# Patient Record
Sex: Female | Born: 1990 | Race: White | Hispanic: No | Marital: Single | Smoking: Never smoker
Health system: Southern US, Community
[De-identification: ages and names within clinical notes are randomized; demographics above are authoritative.]

## PROBLEM LIST (undated history)

## (undated) ENCOUNTER — Inpatient Hospital Stay (HOSPITAL_COMMUNITY): Payer: Self-pay

## (undated) DIAGNOSIS — J45909 Unspecified asthma, uncomplicated: Secondary | ICD-10-CM

## (undated) DIAGNOSIS — F99 Mental disorder, not otherwise specified: Secondary | ICD-10-CM

## (undated) DIAGNOSIS — R51 Headache: Secondary | ICD-10-CM

## (undated) DIAGNOSIS — R519 Headache, unspecified: Secondary | ICD-10-CM

## (undated) DIAGNOSIS — Z789 Other specified health status: Secondary | ICD-10-CM

## (undated) HISTORY — PX: NO PAST SURGERIES: SHX2092

## (undated) HISTORY — DX: Unspecified asthma, uncomplicated: J45.909

## (undated) HISTORY — DX: Headache, unspecified: R51.9

## (undated) HISTORY — DX: Headache: R51

---

## 2015-10-13 LAB — OB RESULTS CONSOLE RUBELLA ANTIBODY, IGM: RUBELLA: IMMUNE

## 2015-10-13 LAB — OB RESULTS CONSOLE GC/CHLAMYDIA
Chlamydia: NEGATIVE
GC PROBE AMP, GENITAL: NEGATIVE

## 2015-10-13 LAB — OB RESULTS CONSOLE ANTIBODY SCREEN: ANTIBODY SCREEN: NEGATIVE

## 2015-10-13 LAB — OB RESULTS CONSOLE HEPATITIS B SURFACE ANTIGEN: Hepatitis B Surface Ag: NEGATIVE

## 2015-10-13 LAB — OB RESULTS CONSOLE HIV ANTIBODY (ROUTINE TESTING): HIV: NONREACTIVE

## 2015-10-13 LAB — OB RESULTS CONSOLE ABO/RH: RH Type: POSITIVE

## 2015-10-13 LAB — OB RESULTS CONSOLE RPR: RPR: NONREACTIVE

## 2016-03-17 ENCOUNTER — Inpatient Hospital Stay (HOSPITAL_COMMUNITY)
Admission: AD | Admit: 2016-03-17 | Discharge: 2016-03-17 | Disposition: A | Discharge status: Home / Self Care | Payer: Medicaid Other | Source: Ambulatory Visit | Attending: Obstetrics and Gynecology | Admitting: Obstetrics and Gynecology

## 2016-03-17 ENCOUNTER — Encounter (HOSPITAL_COMMUNITY): Payer: Self-pay | Admitting: *Deleted

## 2016-03-17 DIAGNOSIS — M549 Dorsalgia, unspecified: Secondary | ICD-10-CM

## 2016-03-17 DIAGNOSIS — B373 Candidiasis of vulva and vagina: Secondary | ICD-10-CM | POA: Diagnosis not present

## 2016-03-17 DIAGNOSIS — B3731 Acute candidiasis of vulva and vagina: Secondary | ICD-10-CM

## 2016-03-17 DIAGNOSIS — O26893 Other specified pregnancy related conditions, third trimester: Secondary | ICD-10-CM

## 2016-03-17 DIAGNOSIS — Z3A29 29 weeks gestation of pregnancy: Secondary | ICD-10-CM

## 2016-03-17 DIAGNOSIS — O98813 Other maternal infectious and parasitic diseases complicating pregnancy, third trimester: Secondary | ICD-10-CM | POA: Insufficient documentation

## 2016-03-17 DIAGNOSIS — Z3689 Encounter for other specified antenatal screening: Secondary | ICD-10-CM

## 2016-03-17 DIAGNOSIS — O9989 Other specified diseases and conditions complicating pregnancy, childbirth and the puerperium: Secondary | ICD-10-CM

## 2016-03-17 LAB — URINALYSIS, ROUTINE W REFLEX MICROSCOPIC
BILIRUBIN URINE: NEGATIVE
Glucose, UA: NEGATIVE mg/dL
Hgb urine dipstick: NEGATIVE
Ketones, ur: NEGATIVE mg/dL
Leukocytes, UA: NEGATIVE
NITRITE: NEGATIVE
PH: 6 (ref 5.0–8.0)
Protein, ur: NEGATIVE mg/dL
Specific Gravity, Urine: 1.009 (ref 1.005–1.030)

## 2016-03-17 LAB — WET PREP, GENITAL
Clue Cells Wet Prep HPF POC: NONE SEEN
Sperm: NONE SEEN
TRICH WET PREP: NONE SEEN

## 2016-03-17 MED ORDER — FLUCONAZOLE 150 MG PO TABS: 150.0000 mg | ORAL_TABLET | Freq: Every day | ORAL | 0 refills | Status: AC

## 2016-03-17 MED ORDER — ACETAMINOPHEN 500 MG PO TABS
1000.0000 mg | ORAL_TABLET | Freq: Four times a day (QID) | ORAL | Status: DC | PRN
  Filled 2016-03-17: qty 2

## 2016-03-17 NOTE — Discharge Instructions (Signed)
Back Pain in Pregnancy Introduction Back pain during pregnancy is common. Back pain may be caused by several factors that are related to changes during your pregnancy. Follow these instructions at home: Managing pain, stiffness, and swelling  If directed, apply ice for sudden (acute) back pain.  Put ice in a plastic bag.  Place a towel between your skin and the bag.  Leave the ice on for 20 minutes, 2-3 times per day.  If directed, apply heat to the affected area before you exercise:  Place a towel between your skin and the heat pack or heating pad.  Leave the heat on for 20-30 minutes.  Remove the heat if your skin turns bright red. This is especially important if you are unable to feel pain, heat, or cold. You may have a greater risk of getting burned. Activity  Exercise as told by your health care provider. Exercising is the best way to prevent or manage back pain.  Listen to your body when lifting. If lifting hurts, ask for help or bend your knees. This uses your leg muscles instead of your back muscles.  Squat down when picking up something from the floor. Do not bend over.  Only use bed rest as told by your health care provider. Bed rest should only be used for the most severe episodes of back pain. Standing, Sitting, and Lying Down  Do not stand in one place for long periods of time.  Use good posture when sitting. Make sure your head rests over your shoulders and is not hanging forward. Use a pillow on your lower back if necessary.  Try sleeping on your side, preferably the left side, with a pillow or two between your legs. If you are sore after a night's rest, your bed may be too soft. A firm mattress may provide more support for your back during pregnancy. General instructions  Do not wear high heels.  Eat a healthy diet. Try to gain weight within your health care provider's recommendations.  Use a maternity girdle, elastic sling, or back brace as told by your  health care provider.  Take over-the-counter and prescription medicines only as told by your health care provider.  Keep all follow-up visits as told by your health care provider. This is important. This includes any visits with any specialists, such as a physical therapist. Contact a health care provider if:  Your back pain interferes with your daily activities.  You have increasing pain in other parts of your body. Get help right away if:  You develop numbness, tingling, weakness, or problems with the use of your arms or legs.  You develop severe back pain that is not controlled with medicine.  You have a sudden change in bowel or bladder control.  You develop shortness of breath, dizziness, or you faint.  You develop nausea, vomiting, or sweating.  You have back pain that is a rhythmic, cramping pain similar to labor pains. Labor pain is usually 1-2 minutes apart, lasts for about 1 minute, and involves a bearing down feeling or pressure in your pelvis.  You have back pain and your water breaks or you have vaginal bleeding.  You have back pain or numbness that travels down your leg.  Your back pain developed after you fell.  You develop pain on one side of your back.  You see blood in your urine.  You develop skin blisters in the area of your back pain. This information is not intended to replace advice given to   you by your health care provider. Make sure you discuss any questions you have with your health care provider. Document Released: 04/20/2005 Document Revised: 06/18/2015 Document Reviewed: 09/24/2014  2017 [employer]vier  

## 2016-03-17 NOTE — MAU Provider Note (Signed)
History     CSN: [id number]  Arrival date and time: 03/17/16 1622   None     Chief Complaint  Patient presents with  . Contractions   G1 @29 .6 weeks here with ctx and back pain. She reports feeling 2 ctx about 15 min apart around 1100 this am. No ctx since. She reports constant lower back pain since. Rates pain 3/10 now, was worse earlier. Used a heating pad and had much improvement. Denies VB and LOF. +FM. She is eating well and had about 64 oz of water today. She was working when sx started and reports being on her feet a lot and bending down a lot and working 42-48 hrs per week.    OB History    Gravida Para Term Preterm AB Living   1             SAB TAB Ectopic Multiple Live Births                  History reviewed. No pertinent past medical history.  History reviewed. No pertinent surgical history.  History reviewed. No pertinent family history.  Social History  Substance Use Topics  . Smoking status: Not on file  . Smokeless tobacco: Not on file  . Alcohol use Not on file    Allergies: No Known Allergies  Prescriptions Prior to Admission  Medication Sig Dispense Refill Last Dose  . ferrous sulfate 325 (65 FE) MG EC tablet Take 325 mg by mouth daily with breakfast.   03/17/2016 at Unknown time  . Prenatal Vit-Fe Fumarate-FA (PRENATAL MULTIVITAMIN) TABS tablet Take 1 tablet by mouth daily at 12 noon.   03/17/2016 at Unknown time    Review of Systems  Gastrointestinal: Positive for abdominal pain.  Genitourinary: Negative for dysuria, hematuria, vaginal bleeding and vaginal discharge.   Physical Exam   Blood pressure 118/66, pulse 79, temperature 98.7 F (37.1 C), temperature source Oral, resp. rate 18, height 5\' 5"  (1.651 m), weight 67.2 kg (148 lb 1.9 oz).  Physical Exam  Nursing note and vitals reviewed. Constitutional: She is oriented to person, place, and time. She appears well-developed and well-nourished. No distress.  HENT:  Head: Normocephalic and  atraumatic.  Neck: Normal range of motion.  Cardiovascular: Normal rate.   Respiratory: Effort normal.  GI: Soft. She exhibits no distension. There is no tenderness. There is no CVA tenderness.  gravid  Genitourinary:  Genitourinary Comments: External: no lesions or erythema Vagina: rugated, nulli, white curdy discharge SVE: closed/thick  Musculoskeletal: Normal range of motion.  Neurological: She is alert and oriented to person, place, and time.  Skin: Skin is warm and dry.  Psychiatric: She has a normal mood and affect.  EFM: 150 bpm, mod variability, + accels, no decels Toco: x1, mild  Results for orders placed or performed during the hospital encounter of 03/17/16 (from the past 24 hour(s))  Urinalysis, Routine w reflex microscopic     Status: None   Collection Time: 03/17/16  4:30 PM  Result Value Ref Range   Color, Urine YELLOW YELLOW   APPearance CLEAR CLEAR   Specific Gravity, Urine 1.009 1.005 - 1.030   pH 6.0 5.0 - 8.0   Glucose, UA NEGATIVE NEGATIVE mg/dL   Hgb urine dipstick NEGATIVE NEGATIVE   Bilirubin Urine NEGATIVE NEGATIVE   Ketones, ur NEGATIVE NEGATIVE mg/dL   Protein, ur NEGATIVE NEGATIVE mg/dL   Nitrite NEGATIVE NEGATIVE   Leukocytes, UA NEGATIVE NEGATIVE  Wet prep, genital  Status: Abnormal   Collection Time: 03/17/16  5:20 PM  Result Value Ref Range   Yeast Wet Prep HPF POC PRESENT (A) NONE SEEN   Trich, Wet Prep NONE SEEN NONE SEEN   Clue Cells Wet Prep HPF POC NONE SEEN NONE SEEN   WBC, Wet Prep HPF POC MODERATE (A) NONE SEEN   Sperm NONE SEEN     MAU Course  Procedures Tylenol ordered-declined  MDM Labs ordered and reviewed. No evidence of PTL or UTI. Will treat yeast  Presentation, clinical findings, and plan discussed with Dr. Rana SnareLowe. Stable for discharge home.  Assessment and Plan   1. [redacted] weeks gestation of pregnancy   2. NST (non-stress test) reactive   3. Back pain affecting pregnancy in third trimester   4. Yeast vaginitis     Discharge home PTL precautions Follow up in office as scheduled  Maternity support belt Heating pad/warm bath  Allergies as of 03/17/2016   No Known Allergies     Medication List    TAKE these medications   ferrous sulfate 325 (65 FE) MG EC tablet Take 325 mg by mouth daily with breakfast.   fluconazole 150 MG tablet Commonly known as:  DIFLUCAN Take 1 tablet (150 mg total) by mouth daily.   prenatal multivitamin Tabs tablet Take 1 tablet by mouth daily at 12 noon.      Donette LarryMelanie Fonda Rochon, CNM 03/17/2016, 5:07 PM

## 2016-03-17 NOTE — MAU Note (Signed)
Pt reports she was at work and she felt 2 contractions at 11:00 today. Then her back started hurting. Back pain is constant andgets worse when she bends over. Feeling pain in her lower right hip when the baby"kicks it"

## 2016-03-18 LAB — GC/CHLAMYDIA PROBE AMP (~~LOC~~) NOT AT ARMC
CHLAMYDIA, DNA PROBE: NEGATIVE
NEISSERIA GONORRHEA: NEGATIVE

## 2016-04-26 ENCOUNTER — Inpatient Hospital Stay (HOSPITAL_COMMUNITY)
Admission: AD | Admit: 2016-04-26 | Discharge: 2016-04-26 | Disposition: A | Discharge status: Home / Self Care | Payer: 59 | Source: Ambulatory Visit | Attending: Family Medicine | Admitting: Family Medicine

## 2016-04-26 ENCOUNTER — Encounter (HOSPITAL_COMMUNITY): Payer: Self-pay

## 2016-04-26 DIAGNOSIS — Z79899 Other long term (current) drug therapy: Secondary | ICD-10-CM | POA: Insufficient documentation

## 2016-04-26 DIAGNOSIS — O9989 Other specified diseases and conditions complicating pregnancy, childbirth and the puerperium: Secondary | ICD-10-CM | POA: Diagnosis not present

## 2016-04-26 DIAGNOSIS — K529 Noninfective gastroenteritis and colitis, unspecified: Secondary | ICD-10-CM

## 2016-04-26 DIAGNOSIS — O26899 Other specified pregnancy related conditions, unspecified trimester: Secondary | ICD-10-CM | POA: Diagnosis not present

## 2016-04-26 DIAGNOSIS — Z3A00 Weeks of gestation of pregnancy not specified: Secondary | ICD-10-CM | POA: Diagnosis not present

## 2016-04-26 DIAGNOSIS — R1110 Vomiting, unspecified: Secondary | ICD-10-CM | POA: Diagnosis present

## 2016-04-26 HISTORY — DX: Other specified health status: Z78.9

## 2016-04-26 LAB — URINALYSIS, ROUTINE W REFLEX MICROSCOPIC
Bilirubin Urine: NEGATIVE
Glucose, UA: NEGATIVE mg/dL
HGB URINE DIPSTICK: NEGATIVE
Ketones, ur: NEGATIVE mg/dL
Leukocytes, UA: NEGATIVE
Nitrite: NEGATIVE
Protein, ur: NEGATIVE mg/dL
SPECIFIC GRAVITY, URINE: 1.019 (ref 1.005–1.030)
pH: 6 (ref 5.0–8.0)

## 2016-04-26 MED ORDER — PROMETHAZINE HCL 25 MG/ML IJ SOLN: 12.5000 mg | Freq: Once | INTRAMUSCULAR | Status: DC

## 2016-04-26 MED ORDER — ONDANSETRON 4 MG PO TBDP
4.0000 mg | ORAL_TABLET | Freq: Three times a day (TID) | ORAL | 0 refills | Status: DC | PRN
Start: 2016-04-26 — End: 2016-05-27

## 2016-04-26 MED ORDER — PROMETHAZINE HCL 25 MG/ML IJ SOLN
25.0000 mg | Freq: Once | INTRAMUSCULAR | Status: AC
  Administered 2016-04-26: 25 mg via INTRAVENOUS
  Filled 2016-04-26: qty 1

## 2016-04-26 MED ORDER — PROMETHAZINE HCL 25 MG PO TABS: 25.0000 mg | ORAL_TABLET | Freq: Four times a day (QID) | ORAL | 0 refills | Status: DC | PRN

## 2016-04-26 MED ORDER — FAMOTIDINE IN NACL 20-0.9 MG/50ML-% IV SOLN
20.0000 mg | Freq: Once | INTRAVENOUS | Status: AC
  Administered 2016-04-26: 20 mg via INTRAVENOUS
  Filled 2016-04-26: qty 50

## 2016-04-26 MED ORDER — LACTATED RINGERS IV BOLUS (SEPSIS)
1000.0000 mL | Freq: Once | INTRAVENOUS | Status: AC
  Administered 2016-04-26: 1000 mL via INTRAVENOUS

## 2016-04-26 MED ORDER — DEXTROSE 5 % IN LACTATED RINGERS IV BOLUS
1000.0000 mL | Freq: Once | INTRAVENOUS | Status: AC
  Administered 2016-04-26: 1000 mL via INTRAVENOUS

## 2016-04-26 NOTE — Discharge Instructions (Signed)

## 2016-04-26 NOTE — MAU Provider Note (Signed)
History     CSN: [id number]  Arrival date and time: 04/26/16 0910   First Provider Initiated Contact with Patient 04/26/16 [phone]      Chief Complaint  Patient presents with  . Emesis   HPI   Alicia [name] is a 26 y.o. female G1P0 here in MAU with acute onset of vomiting. At 0317 she started vomiting. Her significant other had the same symptoms on Friday. She attests to vomiting over 20 times since onset.   OB History    Gravida Para Term Preterm AB Living   1             SAB TAB Ectopic Multiple Live Births                  Past Medical History:  Diagnosis Date  . Medical history non-contributory     Past Surgical History:  Procedure Laterality Date  . NO PAST SURGERIES      Family History  Problem Relation Age of Onset  . Hypertension Mother   . Diabetes Maternal Grandmother     Social History  Substance Use Topics  . Smoking status: Never Smoker  . Smokeless tobacco: Never Used  . Alcohol use No    Allergies: No Known Allergies  Prescriptions Prior to Admission  Medication Sig Dispense Refill Last Dose  . ferrous sulfate 325 (65 FE) MG EC tablet Take 325 mg by mouth daily with breakfast.   03/17/2016 at Unknown time  . Prenatal Vit-Fe Fumarate-FA (PRENATAL MULTIVITAMIN) TABS tablet Take 1 tablet by mouth daily at 12 noon.   03/17/2016 at Unknown time   No results found for this or any previous visit (from the past 48 hour(s)).  Review of Systems  Constitutional: Negative for fever.  Gastrointestinal: Positive for diarrhea, nausea and vomiting.  Neurological: Negative for dizziness and headaches.   Physical Exam   Blood pressure (!) 119/57, pulse 99, temperature 99.3 F (37.4 C), temperature source Oral, resp. rate 18, height  (1.651 m), SpO2 100 %.  Physical Exam  Constitutional: She is oriented to person, place, and time. She appears well-developed and well-nourished. She has a sickly appearance.  HENT:  Head: Normocephalic.  Eyes: Pupils  are equal, round, and reactive to light.  Musculoskeletal: Normal range of motion.  Neurological: She is alert and oriented to person, place, and time.  Skin: Skin is warm.  Psychiatric: Her behavior is normal.   Fetal Tracing:  Baseline: 145 Variability: moderate Accelerations: 15x15 Decelerations: none  Toco: irr ctx MAU Course  Procedures  Results for orders placed or performed during the hospital encounter of 04/26/16 (from the past 24 hour(s))  Urinalysis, Routine w reflex microscopic     Status: Abnormal   Collection Time: 04/26/16  9:20 AM  Result Value Ref Range   Color, Urine YELLOW YELLOW   APPearance HAZY (A) CLEAR   Specific Gravity, Urine 1.019 1.005 - 1.030   pH 6.0 5.0 - 8.0   Glucose, UA NEGATIVE NEGATIVE mg/dL   Hgb urine dipstick NEGATIVE NEGATIVE   Bilirubin Urine NEGATIVE NEGATIVE   Ketones, ur NEGATIVE NEGATIVE mg/dL   Protein, ur NEGATIVE NEGATIVE mg/dL   Nitrite NEGATIVE NEGATIVE   Leukocytes, UA NEGATIVE NEGATIVE     MDM  UA - pending  LR bolus X 1 D5LR bolus X 1 Pepcid 20 mg IV Phenergan 25 mg IV: patient has significant other here to drive her home.   Report given to Collene Gobble CNM who resumes care of  the patient.  Duane Lope, NP  No vomiting since receiving IV phenergan & pt able to keep down crackers & ginger ale. Will d/c home with antiemetics Assessment and Plan  A: 1. Gastroenteritis, acute    P: Discharge home Rx phenergan & zofran Discussed reasons to return to MAU Keep follow up appointment with OB/PCP  Good hand hygiene, don't prepare food for others, & advance diet as tolerated  Judeth Horn, NP

## 2016-04-26 NOTE — MAU Note (Signed)
Pt c/o vomiting that started at 0300am today. Pt c/o diarrhea that started shortly after. Pt states she is unable to keep water down. Pt states baby is moving normally. Pt denies contractions, bleeding, and leaking of fluid.

## 2016-05-17 ENCOUNTER — Encounter (HOSPITAL_COMMUNITY): Payer: Self-pay

## 2016-05-17 LAB — OB RESULTS CONSOLE GBS: STREP GROUP B AG: NEGATIVE

## 2016-05-23 NOTE — H&P (Signed)
Alicia [name] is a 26 y.o. female presenting for primary cesarean section secondary to breech presentation; declined ECV.  Antepartum course complicated by h/o mood disorder on no medication and stable this pregnancy.  GBS negative.  OB History    Gravida Para Term Preterm AB Living   1             SAB TAB Ectopic Multiple Live Births                 Past Medical History:  Diagnosis Date  . Headache   . Medical history non-contributory   . 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 NO PAST SURGERIES     Family History: family history includes Diabetes in her maternal grandmother; Hypertension in her mother; Other in her cousin. Social History:  reports that she has never smoked. She has never used smokeless tobacco. She reports that she does not drink alcohol or use drugs.     Maternal Diabetes: No Genetic Screening: Normal Maternal Ultrasounds/Referrals: Normal Fetal Ultrasounds or other Referrals:  None Maternal Substance Abuse:  No Significant Maternal Medications:  None Significant Maternal Lab Results:  Lab values include: Group B Strep negative Other Comments:  None  ROS Maternal Medical History:  Prenatal complications: no prenatal complications Prenatal Complications - Diabetes: none.      There were no vitals taken for this visit. Maternal Exam:  Abdomen: Patient reports no abdominal tenderness. Fundal height is c/w dates.   Estimated fetal weight is 7#4.   Fetal presentation: breech     Physical Exam  Constitutional: She is oriented to person, place, and time. She appears well-developed and well-nourished.  GI: Soft. There is no rebound and no guarding.  Neurological: She is alert and oriented to person, place, and time.  Skin: Skin is warm and dry.  Psychiatric: She has a normal mood and affect. Her behavior is normal.    Prenatal labs: ABO, Rh:  O/Positive/-- (09/19 0000) Antibody: Negative (09/19 0000) Rubella: Immune (09/19 0000) RPR: Nonreactive (09/19 0000)  HBsAg: Negative (09/19 0000)  HIV: Non-reactive (09/19 0000)  GBS: Negative (04/24 0000)   Assessment/Plan: 25yo G1 at 39w with breech presentation -Primary C/S -Patient has been counseled re: risk of bleeding, infection, scarring, damage to surrounding structures. And implications in future pregnancies.  All questions were answered and the patient wishes to proceed.  Leela Vanbrocklin 05/23/2016, 8:52 PM

## 2016-05-24 ENCOUNTER — Encounter (HOSPITAL_COMMUNITY)
Admission: RE | Admit: 2016-05-24 | Discharge: 2016-05-24 | Disposition: A | Discharge status: Home / Self Care | Payer: 59 | Source: Ambulatory Visit | Attending: Obstetrics & Gynecology | Admitting: Obstetrics & Gynecology

## 2016-05-24 HISTORY — DX: Mental disorder, not otherwise specified: F99

## 2016-05-24 HISTORY — DX: Headache: R51

## 2016-05-24 HISTORY — DX: Headache, unspecified: R51.9

## 2016-05-24 LAB — TYPE AND SCREEN
ABO/RH(D): O POS
Antibody Screen: NEGATIVE

## 2016-05-24 LAB — ABO/RH: ABO/RH(D): O POS

## 2016-05-24 LAB — CBC
HCT: 33 % — ABNORMAL LOW (ref 36.0–46.0)
Hemoglobin: 11.4 g/dL — ABNORMAL LOW (ref 12.0–15.0)
MCH: 33.9 pg (ref 26.0–34.0)
MCHC: 34.5 g/dL (ref 30.0–36.0)
MCV: 98.2 fL (ref 78.0–100.0)
PLATELETS: 236 10*3/uL (ref 150–400)
RBC: 3.36 MIL/uL — AB (ref 3.87–5.11)
RDW: 14.1 % (ref 11.5–15.5)
WBC: 10.3 10*3/uL (ref 4.0–10.5)

## 2016-05-24 NOTE — Patient Instructions (Signed)
19 Alicia [name]  05/24/2016   Your procedure is scheduled on:  05/25/2016  Enter through the Main Entrance of [facility] at 1100 AM.  Pick up the phone at the desk and dial [phone].   Call this number if you have problems the morning of surgery: [phone]   Remember:   Do not eat food:After Midnight.  Do not drink clear liquids: After Midnight.  Take these medicines the morning of surgery with A SIP OF WATER: none   Do not wear jewelry, make-up or nail polish.  Do not wear lotions, powders, or perfumes. Do not wear deodorant.  Do not shave 48 hours prior to surgery.  Do not bring valuables to the hospital.  [facility] is not   responsible for any belongings or valuables brought to the hospital.  Contacts, dentures or bridgework may not be worn into surgery.  Leave suitcase in the car. After surgery it may be brought to your room.  For patients admitted to the hospital, checkout time is 11:00 AM the day of              discharge.   Patients discharged the day of surgery will not be allowed to drive             home.  Name and phone number of your driver: na  Special Instructions:   N/A   Please read over the following fact sheets that you were given:   Surgical Site Infection Prevention

## 2016-05-25 ENCOUNTER — Encounter (HOSPITAL_COMMUNITY): Payer: Self-pay | Admitting: *Deleted

## 2016-05-25 ENCOUNTER — Inpatient Hospital Stay (HOSPITAL_COMMUNITY): Payer: 59 | Admitting: Anesthesiology

## 2016-05-25 ENCOUNTER — Inpatient Hospital Stay (HOSPITAL_COMMUNITY)
Admission: RE | Admit: 2016-05-25 | Discharge: 2016-05-27 | DRG: 766 | Disposition: A | Discharge status: Home / Self Care | Payer: 59 | Source: Ambulatory Visit | Attending: Obstetrics & Gynecology | Admitting: Obstetrics & Gynecology

## 2016-05-25 ENCOUNTER — Encounter (HOSPITAL_COMMUNITY)
Admission: RE | Disposition: A | Discharge status: Home / Self Care | Payer: Self-pay | Source: Ambulatory Visit | Attending: Obstetrics & Gynecology

## 2016-05-25 DIAGNOSIS — Z3A39 39 weeks gestation of pregnancy: Secondary | ICD-10-CM

## 2016-05-25 DIAGNOSIS — O321XX0 Maternal care for breech presentation, not applicable or unspecified: Secondary | ICD-10-CM | POA: Diagnosis present

## 2016-05-25 DIAGNOSIS — Z98891 History of uterine scar from previous surgery: Secondary | ICD-10-CM

## 2016-05-25 LAB — RPR: RPR Ser Ql: NONREACTIVE

## 2016-05-25 SURGERY — Surgical Case
Anesthesia: Spinal

## 2016-05-25 MED ORDER — SODIUM CHLORIDE 0.9 % IR SOLN
Status: DC | PRN
  Administered 2016-05-25 (×2): 1

## 2016-05-25 MED ORDER — OXYTOCIN 10 UNIT/ML IJ SOLN
INTRAVENOUS | Status: DC | PRN
  Administered 2016-05-25: 40 [IU] via INTRAVENOUS

## 2016-05-25 MED ORDER — SCOPOLAMINE 1 MG/3DAYS TD PT72
1.0000 | MEDICATED_PATCH | Freq: Once | TRANSDERMAL | Status: DC
  Administered 2016-05-25: 1.5 mg via TRANSDERMAL
  Filled 2016-05-25: qty 1

## 2016-05-25 MED ORDER — OXYTOCIN 10 UNIT/ML IJ SOLN
INTRAMUSCULAR | Status: AC
  Filled 2016-05-25: qty 4

## 2016-05-25 MED ORDER — LACTATED RINGERS IV SOLN
INTRAVENOUS | Status: DC
  Administered 2016-05-25: 14:00:00 via INTRAVENOUS

## 2016-05-25 MED ORDER — SIMETHICONE 80 MG PO CHEW
80.0000 mg | CHEWABLE_TABLET | ORAL | Status: DC
  Administered 2016-05-25 – 2016-05-27 (×2): 80 mg via ORAL
  Filled 2016-05-25 (×2): qty 1

## 2016-05-25 MED ORDER — NALOXONE HCL 2 MG/2ML IJ SOSY
1.0000 ug/kg/h | PREFILLED_SYRINGE | INTRAVENOUS | Status: DC | PRN
  Filled 2016-05-25: qty 2

## 2016-05-25 MED ORDER — MORPHINE SULFATE (PF) 0.5 MG/ML IJ SOLN
INTRAMUSCULAR | Status: AC
  Filled 2016-05-25: qty 10

## 2016-05-25 MED ORDER — MEPERIDINE HCL 25 MG/ML IJ SOLN: 6.2500 mg | INTRAMUSCULAR | Status: DC | PRN

## 2016-05-25 MED ORDER — CEFAZOLIN SODIUM-DEXTROSE 2-4 GM/100ML-% IV SOLN
2.0000 g | INTRAVENOUS | Status: AC
  Administered 2016-05-25: 2 g via INTRAVENOUS
  Filled 2016-05-25: qty 100

## 2016-05-25 MED ORDER — NALBUPHINE HCL 10 MG/ML IJ SOLN: 5.0000 mg | Freq: Once | INTRAMUSCULAR | Status: DC | PRN

## 2016-05-25 MED ORDER — KETOROLAC TROMETHAMINE 30 MG/ML IJ SOLN
INTRAMUSCULAR | Status: AC
  Administered 2016-05-25: 30 mg via INTRAMUSCULAR
  Filled 2016-05-25: qty 1

## 2016-05-25 MED ORDER — ZOLPIDEM TARTRATE 5 MG PO TABS: 5.0000 mg | ORAL_TABLET | Freq: Every evening | ORAL | Status: DC | PRN

## 2016-05-25 MED ORDER — PHENYLEPHRINE 8 MG IN D5W 100 ML (0.08MG/ML) PREMIX OPTIME
INJECTION | INTRAVENOUS | Status: AC
  Filled 2016-05-25: qty 100

## 2016-05-25 MED ORDER — SOD CITRATE-CITRIC ACID 500-334 MG/5ML PO SOLN
ORAL | Status: AC
  Filled 2016-05-25: qty 15

## 2016-05-25 MED ORDER — OXYCODONE-ACETAMINOPHEN 5-325 MG PO TABS
1.0000 | ORAL_TABLET | ORAL | Status: DC | PRN
  Administered 2016-05-26 (×2): 1 via ORAL
  Filled 2016-05-25 (×2): qty 1

## 2016-05-25 MED ORDER — SIMETHICONE 80 MG PO CHEW: 80.0000 mg | CHEWABLE_TABLET | ORAL | Status: DC | PRN

## 2016-05-25 MED ORDER — SIMETHICONE 80 MG PO CHEW
80.0000 mg | CHEWABLE_TABLET | Freq: Three times a day (TID) | ORAL | Status: DC
  Administered 2016-05-25 – 2016-05-27 (×5): 80 mg via ORAL
  Filled 2016-05-25 (×5): qty 1

## 2016-05-25 MED ORDER — TETANUS-DIPHTH-ACELL PERTUSSIS 5-2.5-18.5 LF-MCG/0.5 IM SUSP: 0.5000 mL | Freq: Once | INTRAMUSCULAR | Status: DC

## 2016-05-25 MED ORDER — PRENATAL MULTIVITAMIN CH
1.0000 | ORAL_TABLET | Freq: Every day | ORAL | Status: DC
  Filled 2016-05-25 (×2): qty 1

## 2016-05-25 MED ORDER — PROMETHAZINE HCL 25 MG/ML IJ SOLN: 6.2500 mg | INTRAMUSCULAR | Status: DC | PRN

## 2016-05-25 MED ORDER — HYDROMORPHONE HCL 1 MG/ML IJ SOLN: 0.2500 mg | INTRAMUSCULAR | Status: DC | PRN

## 2016-05-25 MED ORDER — WITCH HAZEL-GLYCERIN EX PADS: 1.0000 "application " | MEDICATED_PAD | CUTANEOUS | Status: DC | PRN

## 2016-05-25 MED ORDER — PHENYLEPHRINE 8 MG IN D5W 100 ML (0.08MG/ML) PREMIX OPTIME
INJECTION | INTRAVENOUS | Status: DC | PRN
  Administered 2016-05-25: 60 ug/min via INTRAVENOUS

## 2016-05-25 MED ORDER — NALBUPHINE HCL 10 MG/ML IJ SOLN: 5.0000 mg | INTRAMUSCULAR | Status: DC | PRN

## 2016-05-25 MED ORDER — DIPHENHYDRAMINE HCL 50 MG/ML IJ SOLN: 12.5000 mg | INTRAMUSCULAR | Status: DC | PRN

## 2016-05-25 MED ORDER — ONDANSETRON HCL 4 MG/2ML IJ SOLN
INTRAMUSCULAR | Status: DC | PRN
  Administered 2016-05-25: 4 mg via INTRAVENOUS

## 2016-05-25 MED ORDER — COCONUT OIL OIL
1.0000 "application " | TOPICAL_OIL | Status: DC | PRN
  Administered 2016-05-27: 1 via TOPICAL
  Filled 2016-05-25: qty 120

## 2016-05-25 MED ORDER — KETOROLAC TROMETHAMINE 30 MG/ML IJ SOLN
30.0000 mg | Freq: Four times a day (QID) | INTRAMUSCULAR | Status: AC | PRN
  Administered 2016-05-25: 30 mg via INTRAMUSCULAR

## 2016-05-25 MED ORDER — OXYTOCIN 40 UNITS IN LACTATED RINGERS INFUSION - SIMPLE MED
2.5000 [IU]/h | INTRAVENOUS | Status: AC
Start: 2016-05-25 — End: 2016-05-26

## 2016-05-25 MED ORDER — MORPHINE SULFATE (PF) 0.5 MG/ML IJ SOLN
INTRAMUSCULAR | Status: DC | PRN
  Administered 2016-05-25: .2 mg via EPIDURAL

## 2016-05-25 MED ORDER — ONDANSETRON HCL 4 MG/2ML IJ SOLN
INTRAMUSCULAR | Status: AC
  Filled 2016-05-25: qty 2

## 2016-05-25 MED ORDER — ACETAMINOPHEN 325 MG PO TABS: 650.0000 mg | ORAL_TABLET | ORAL | Status: DC | PRN

## 2016-05-25 MED ORDER — MENTHOL 3 MG MT LOZG: 1.0000 | LOZENGE | OROMUCOSAL | Status: DC | PRN

## 2016-05-25 MED ORDER — DIBUCAINE 1 % RE OINT: 1.0000 "application " | TOPICAL_OINTMENT | RECTAL | Status: DC | PRN

## 2016-05-25 MED ORDER — IBUPROFEN 600 MG PO TABS
600.0000 mg | ORAL_TABLET | Freq: Four times a day (QID) | ORAL | Status: DC
  Administered 2016-05-25 – 2016-05-27 (×7): 600 mg via ORAL
  Filled 2016-05-25 (×7): qty 1

## 2016-05-25 MED ORDER — DIPHENHYDRAMINE HCL 25 MG PO CAPS: 25.0000 mg | ORAL_CAPSULE | Freq: Four times a day (QID) | ORAL | Status: DC | PRN

## 2016-05-25 MED ORDER — SOD CITRATE-CITRIC ACID 500-334 MG/5ML PO SOLN
30.0000 mL | Freq: Once | ORAL | Status: AC
  Administered 2016-05-25: 30 mL via ORAL

## 2016-05-25 MED ORDER — NALOXONE HCL 0.4 MG/ML IJ SOLN
0.4000 mg | INTRAMUSCULAR | Status: DC | PRN
Start: 2016-05-25 — End: 2016-05-27

## 2016-05-25 MED ORDER — DIPHENHYDRAMINE HCL 25 MG PO CAPS
25.0000 mg | ORAL_CAPSULE | ORAL | Status: DC | PRN
  Filled 2016-05-25: qty 1

## 2016-05-25 MED ORDER — SODIUM CHLORIDE 0.9% FLUSH
3.0000 mL | INTRAVENOUS | Status: DC | PRN
Start: 2016-05-25 — End: 2016-05-27

## 2016-05-25 MED ORDER — ONDANSETRON HCL 4 MG/2ML IJ SOLN: 4.0000 mg | Freq: Three times a day (TID) | INTRAMUSCULAR | Status: DC | PRN

## 2016-05-25 MED ORDER — LACTATED RINGERS IV SOLN: INTRAVENOUS | Status: DC

## 2016-05-25 MED ORDER — SENNOSIDES-DOCUSATE SODIUM 8.6-50 MG PO TABS
2.0000 | ORAL_TABLET | ORAL | Status: DC
  Administered 2016-05-25 – 2016-05-27 (×2): 2 via ORAL
  Filled 2016-05-25 (×2): qty 2

## 2016-05-25 MED ORDER — LACTATED RINGERS IV SOLN
INTRAVENOUS | Status: DC | PRN
  Administered 2016-05-25 (×3): via INTRAVENOUS

## 2016-05-25 MED ORDER — SCOPOLAMINE 1 MG/3DAYS TD PT72: 1.0000 | MEDICATED_PATCH | Freq: Once | TRANSDERMAL | Status: DC

## 2016-05-25 MED ORDER — BUPIVACAINE HCL (PF) 0.5 % IJ SOLN
INTRAMUSCULAR | Status: AC
  Filled 2016-05-25: qty 30

## 2016-05-25 MED ORDER — OXYCODONE-ACETAMINOPHEN 5-325 MG PO TABS: 2.0000 | ORAL_TABLET | ORAL | Status: DC | PRN

## 2016-05-25 MED ORDER — BUPIVACAINE HCL (PF) 0.5 % IJ SOLN
INTRAMUSCULAR | Status: DC | PRN
  Administered 2016-05-25: 2.2 mL via INTRATHECAL

## 2016-05-25 MED ORDER — KETOROLAC TROMETHAMINE 30 MG/ML IJ SOLN: 30.0000 mg | Freq: Four times a day (QID) | INTRAMUSCULAR | Status: AC | PRN

## 2016-05-25 SURGICAL SUPPLY — 34 items
BENZOIN TINCTURE PRP APPL 2/3 (GAUZE/BANDAGES/DRESSINGS) ×2 IMPLANT
CHLORAPREP W/TINT 26ML (MISCELLANEOUS) ×2 IMPLANT
CLAMP CORD UMBIL (MISCELLANEOUS) IMPLANT
CLOTH BEACON ORANGE TIMEOUT ST (SAFETY) ×2 IMPLANT
DERMABOND ADVANCED (GAUZE/BANDAGES/DRESSINGS)
DERMABOND ADVANCED .7 DNX12 (GAUZE/BANDAGES/DRESSINGS) IMPLANT
DRSG OPSITE POSTOP 4X10 (GAUZE/BANDAGES/DRESSINGS) ×2 IMPLANT
ELECT REM PT RETURN 9FT ADLT (ELECTROSURGICAL) ×2
ELECTRODE REM PT RTRN 9FT ADLT (ELECTROSURGICAL) ×1 IMPLANT
EXTRACTOR VACUUM KIWI (MISCELLANEOUS) IMPLANT
GLOVE BIO SURGEON STRL SZ 6 (GLOVE) ×2 IMPLANT
GLOVE BIOGEL M 7.0 STRL (GLOVE) ×2 IMPLANT
GLOVE BIOGEL PI IND STRL 6 (GLOVE) ×2 IMPLANT
GLOVE BIOGEL PI IND STRL 7.0 (GLOVE) ×2 IMPLANT
GLOVE BIOGEL PI INDICATOR 6 (GLOVE) ×2
GLOVE BIOGEL PI INDICATOR 7.0 (GLOVE) ×2
GOWN STRL REUS W/TWL LRG LVL3 (GOWN DISPOSABLE) ×4 IMPLANT
KIT ABG SYR 3ML LUER SLIP (SYRINGE) ×2 IMPLANT
NEEDLE HYPO 25X5/8 SAFETYGLIDE (NEEDLE) ×2 IMPLANT
NS IRRIG 1000ML POUR BTL (IV SOLUTION) ×2 IMPLANT
PACK C SECTION WH (CUSTOM PROCEDURE TRAY) ×2 IMPLANT
PAD OB MATERNITY 4.3X12.25 (PERSONAL CARE ITEMS) ×2 IMPLANT
PENCIL SMOKE EVAC W/HOLSTER (ELECTROSURGICAL) ×2 IMPLANT
STRIP CLOSURE SKIN 1/2X4 (GAUZE/BANDAGES/DRESSINGS) ×2 IMPLANT
SUT CHROMIC 0 CTX 36 (SUTURE) ×6 IMPLANT
SUT MON AB 2-0 CT1 27 (SUTURE) ×2 IMPLANT
SUT PDS AB 0 CT1 27 (SUTURE) IMPLANT
SUT PLAIN 0 NONE (SUTURE) IMPLANT
SUT PLAIN 2 0 (SUTURE) ×1
SUT PLAIN ABS 2-0 CT1 27XMFL (SUTURE) ×1 IMPLANT
SUT VIC AB 0 CT1 36 (SUTURE) IMPLANT
SUT VIC AB 4-0 KS 27 (SUTURE) IMPLANT
TOWEL OR 17X24 6PK STRL BLUE (TOWEL DISPOSABLE) ×2 IMPLANT
TRAY FOLEY BAG SILVER LF 14FR (SET/KITS/TRAYS/PACK) IMPLANT

## 2016-05-25 NOTE — Op Note (Signed)
Danicia L Snellings PROCEDURE DATE: 05/25/2016  PREOPERATIVE DIAGNOSIS: Intrauterine pregnancy at  [redacted]w[redacted]d weeks gestation, breech  POSTOPERATIVE DIAGNOSIS: The same  PROCEDURE: Primary Low Transverse Cesarean Section  SURGEON:  Dr. Mitchel Honour  INDICATIONS: Alicia [name] is a 26 y.o. G1P0 at [redacted]w[redacted]d scheduled for cesarean section secondary to breech presentation.  The risks of cesarean section discussed with the patient included but were not limited to: bleeding which may require transfusion or reoperation; infection which may require antibiotics; injury to bowel, bladder, ureters or other surrounding organs; injury to the fetus; need for additional procedures including hysterectomy in the event of a life-threatening hemorrhage; placental abnormalities wth subsequent pregnancies, incisional problems, thromboembolic phenomenon and other postoperative/anesthesia complications. The patient concurred with the proposed plan, giving informed written consent for the procedure.    FINDINGS:  Viable female infant in breech presentation, APGARs per NICU:  Weight pending.  Clear amniotic fluid.  Intact placenta, three vessel cord.  Grossly normal uterus, ovaries and fallopian tubes. .   ANESTHESIA: Spinal ESTIMATED BLOOD LOSS: 600 ml SPECIMENS: Placenta sent to L&D COMPLICATIONS: None immediate  PROCEDURE IN DETAIL:  The patient received intravenous antibiotics and had sequential compression devices applied to her lower extremities while in the preoperative area.  She was then taken to the operating room where spinal anesthesia was administered and was found to be adequate. She was then placed in a dorsal supine position with a leftward tilt, and prepped and draped in a sterile manner.  A foley catheter was placed into her bladder and attached to constant gravity.  After an adequate timeout was performed, a Pfannenstiel skin incision was made with scalpel and carried through to the underlying layer of fascia.  The fascia was incised in the midline and this incision was extended bilaterally using the Mayo scissors. Kocher clamps were applied to the superior aspect of the fascial incision and the underlying rectus muscles were dissected off bluntly. A similar process was carried out on the inferior aspect of the facial incision. The rectus muscles were separated in the midline bluntly and the peritoneum was entered bluntly.  A bladder flap was created sharply and developed bluntly.  Bladder blade was placed to protect the bladder.  A transverse hysterotomy was made with a scalpel and extended bilaterally bluntly. The bladder blade was then removed. The infant was successfully delivered using typical breech maneuvers, and cord was clamped and cut and infant was handed over to awaiting neonatology team. Uterine massage was then administered and the placenta delivered intact with three-vessel cord. The uterus was cleared of clot and debris.  The hysterotomy was closed with 0 chromic.  A second imbricating suture of 0-chromic was used to reinforce the incision and aid in hemostasis.  The peritoneum and rectus muscles were noted to be hemostatic and were reapproximated using 2-0 monocryl in a running fashion.  The fascia was closed with 0-Vicryl in a running fashion with good restoration of anatomy.  The subcutaneus tissue was copiously irrigated and was reapproximated with three interrupted plain gut sutures.  The skin was closed with 4-0 vicryl in a subcuticular fashion.  Pt tolerated the procedure will.  All counts were correct x2.  Pt went to the recovery room in stable condition.

## 2016-05-25 NOTE — Lactation Note (Signed)
This note was copied from a baby's chart. Lactation Consultation Note  Patient Name: Alicia [name] [id number] Date: 05/25/2016 Reason for consult: Initial assessment  Assisted with first time Mom in PACU following C/S for breech position.  Assisted with positioning baby on the breast in football hold.  Hand expression demonstrated.  Colostrum drop expressed.  Baby latched well, and intermittent swallowing identified.  Basic breastfeeding reviewed as baby was feeding.  Mom encouraged to keep baby STS, and feed baby often on cue with a goal of 8-12 feedings per 24 hrs.  Brochure left with Mom.  Informed of IP and OP Lactation services available to her.  Encouraged her to call prn for assistance.  Consult Status Consult Status: Follow-up Date: 05/26/16 Follow-up type: In-patient    [name] 05/25/2016, 3:29 PM

## 2016-05-25 NOTE — Transfer of Care (Signed)
Immediate Anesthesia Transfer of Care Note  Patient: Alicia [name]  Procedure(s) Performed: Procedure(s) with comments: CESAREAN SECTION (N/A) - PRIMARY EDC 05/27/16 NKDA Odelia Gage, RNFA  Patient Location: PACU  Anesthesia Type:Spinal  Level of Consciousness: awake, alert  and oriented  Airway & Oxygen Therapy: Patient Spontanous Breathing  Post-op Assessment: Report given to RN and Post -op Vital signs reviewed and stable  Post vital signs: Reviewed and stable  Last Vitals:  Vitals:   05/25/16 1148  BP: 116/73  Pulse: 91  Resp: 18  Temp: 36.7 C    Last Pain:  Vitals:   05/25/16 1148  TempSrc: Oral      Patients Stated Pain Goal: 0 (05/25/16 1148)  Complications: No apparent anesthesia complications

## 2016-05-25 NOTE — Progress Notes (Addendum)
No change to H&P. Breech confirmed by u/s.  Mitchel Honour, DO

## 2016-05-25 NOTE — Anesthesia Postprocedure Evaluation (Signed)
Anesthesia Post Note  Patient: Alicia [name]  Procedure(s) Performed: Procedure(s) (LRB): CESAREAN SECTION (N/A)  Patient location during evaluation: PACU Anesthesia Type: Spinal Level of consciousness: awake and alert Pain management: pain level controlled Vital Signs Assessment: post-procedure vital signs reviewed and stable Respiratory status: spontaneous breathing and respiratory function stable Cardiovascular status: blood pressure returned to baseline and stable Postop Assessment: spinal receding Anesthetic complications: no       Last Vitals:  Vitals:   05/25/16 1810 05/25/16 1938  BP: 111/60 110/64  Pulse: (!) 56 (!) 54  Resp:    Temp: 36.9 C 36.8 C    Last Pain:  Vitals:   05/25/16 1938  TempSrc:   PainSc: 4                  Lewie Loron

## 2016-05-25 NOTE — Anesthesia Preprocedure Evaluation (Signed)
Anesthesia Evaluation  Patient identified by MRN, date of birth, ID band Patient awake    Reviewed: Allergy & Precautions, NPO status , Patient's Chart, lab work & pertinent test results  Airway Mallampati: II  TM Distance: >3 FB Neck ROM: Full    Dental no notable dental hx.    Pulmonary neg pulmonary ROS,    Pulmonary exam normal breath sounds clear to auscultation       Cardiovascular negative cardio ROS Normal cardiovascular exam Rhythm:Regular Rate:Normal     Neuro/Psych  Headaches, negative psych ROS   GI/Hepatic negative GI ROS, Neg liver ROS,   Endo/Other  negative endocrine ROS  Renal/GU negative Renal ROS     Musculoskeletal negative musculoskeletal ROS (+)   Abdominal   Peds  Hematology negative hematology ROS (+)   Anesthesia Other Findings   Reproductive/Obstetrics (+) Pregnancy                             Anesthesia Physical Anesthesia Plan  ASA: II  Anesthesia Plan: Spinal   Post-op Pain Management:    Induction:   Airway Management Planned:   Additional Equipment:   Intra-op Plan:   Post-operative Plan:   Informed Consent: I have reviewed the patients History and Physical, chart, labs and discussed the procedure including the risks, benefits and alternatives for the proposed anesthesia with the patient or authorized representative who has indicated his/her understanding and acceptance.     Plan Discussed with:   Anesthesia Plan Comments:         Anesthesia Quick Evaluation

## 2016-05-26 LAB — CBC
HEMATOCRIT: 27.5 % — AB (ref 36.0–46.0)
HEMOGLOBIN: 9.6 g/dL — AB (ref 12.0–15.0)
MCH: 33.7 pg (ref 26.0–34.0)
MCHC: 34.9 g/dL (ref 30.0–36.0)
MCV: 96.5 fL (ref 78.0–100.0)
Platelets: 198 10*3/uL (ref 150–400)
RBC: 2.85 MIL/uL — ABNORMAL LOW (ref 3.87–5.11)
RDW: 14 % (ref 11.5–15.5)
WBC: 11.4 10*3/uL — AB (ref 4.0–10.5)

## 2016-05-26 LAB — BIRTH TISSUE RECOVERY COLLECTION (PLACENTA DONATION)

## 2016-05-26 NOTE — Anesthesia Postprocedure Evaluation (Signed)
Anesthesia Post Note  Patient: Alicia [name]  Procedure(s) Performed: Procedure(s) (LRB): CESAREAN SECTION (N/A)  Patient location during evaluation: Mother Baby Anesthesia Type: Spinal Level of consciousness: oriented and awake and alert Pain management: pain level controlled Vital Signs Assessment: post-procedure vital signs reviewed and stable Respiratory status: spontaneous breathing, respiratory function stable and patient connected to nasal cannula oxygen Cardiovascular status: blood pressure returned to baseline and stable Postop Assessment: no headache, no backache and patient able to bend at knees Anesthetic complications: no        Last Vitals:  Vitals:   05/25/16 2030 05/25/16 2330  BP: 110/70 90/64  Pulse: (!) 53 (!) 52  Resp:  18  Temp:  36.8 C    Last Pain:  Vitals:   05/26/16 0634  TempSrc:   PainSc: 4    Pain Goal: Patients Stated Pain Goal: 0 (05/25/16 1148)               Rica RecordsICKELTON,Kenyada Hy

## 2016-05-26 NOTE — Addendum Note (Signed)
Addendum  created 05/26/16 0819 by Edyth Glomb, CRNA   Sign clinical note    

## 2016-05-26 NOTE — Progress Notes (Signed)
Subjective: Postpartum Day 1: Cesarean Delivery Patient reports tolerating PO and no problems voiding.    Objective: Vital signs in last 24 hours: Temp:  [97.6 F (36.4 C)-98.5 F (36.9 C)] 98.3 F (36.8 C) (05/02 2330) Pulse Rate:  [47-91] 52 (05/02 2330) Resp:  [14-20] 18 (05/02 2330) BP: (90-122)/(60-92) 90/64 (05/02 2330) SpO2:  [97 %-100 %] 100 % (05/02 1938)  Physical Exam:  General: alert and cooperative Lochia: appropriate Uterine Fundus: firm Incision: healing well DVT Evaluation: No evidence of DVT seen on physical exam. Negative Homan's sign. No cords or calf tenderness. No significant calf/ankle edema.   Recent Labs  05/24/16 1025 05/26/16 0524  HGB 11.4* 9.6*  HCT 33.0* 27.5*    Assessment/Plan: Status post Cesarean section. Doing well postoperatively.  Continue current care.  Aadam Zhen G 05/26/2016, 8:18 AM

## 2016-05-26 NOTE — Progress Notes (Signed)
CSW acknowledges consult.  CSW attempted to meet with MOB, however MOB was asleep.  CSW will attempt to visit with MOB at a later time.   Ahilyn Nell Boyd-Gilyard, MSW, LCSW Clinical Social Work [phone]  

## 2016-05-26 NOTE — Lactation Note (Signed)
This note was copied from a baby's chart. Lactation Consultation Note  Patient Name: Alicia [name] ZOXWR'UToday's Date: 05/26/2016   Baby 29 hours old. Mom reports that she attempted to nurse 3 hours earlier, but baby not wanting to nurse. Offered to assist with latching the baby but mom declined stating that her nurse helped her earlier and she feels that she can latch now without assistance. Pointed out to mom that baby has not nursed in over 6 hours, and mom reported that she would nurse her soon.  Maternal Data    Feeding Feeding Type: Breast Fed Length of feed: 0 [name]  LATCH Score/Interventions                      Lactation Tools Discussed/Used     Consult Status      Alicia HayJennifer D Heyli [name] 05/26/2016, 6:46 PM

## 2016-05-27 ENCOUNTER — Ambulatory Visit: Payer: Self-pay

## 2016-05-27 MED ORDER — IBUPROFEN 600 MG PO TABS: 600.0000 mg | ORAL_TABLET | Freq: Four times a day (QID) | ORAL | 0 refills | Status: DC

## 2016-05-27 MED ORDER — OXYCODONE-ACETAMINOPHEN 5-325 MG PO TABS: 1.0000 | ORAL_TABLET | ORAL | 0 refills | Status: DC | PRN

## 2016-05-27 NOTE — Discharge Summary (Signed)
Obstetric Discharge Summary Reason for Admission: cesarean section Prenatal Procedures: ultrasound Intrapartum Procedures: cesarean: low cervical, transverse Postpartum Procedures: none Complications-Operative and Postpartum: none Hemoglobin  Date Value Ref Range Status  05/26/2016 9.6 (L) 12.0 - 15.0 g/dL Final   HCT  Date Value Ref Range Status  05/26/2016 27.5 (L) 36.0 - 46.0 % Final    Physical Exam:  General: alert and cooperative Lochia: appropriate Uterine Fundus: firm Incision: no significant drainage DVT Evaluation: No evidence of DVT seen on physical exam.  Discharge Diagnoses: Term Pregnancy-delivered  Discharge Information: Date: 05/27/2016 Activity: pelvic rest Diet: routine Medications: PNV, Ibuprofen and Percocet Condition: stable Instructions: refer to practice specific booklet Discharge to: home Follow-up Information    MORRIS, MEGAN, DO. Schedule an appointment as soon as possible for a visit in 2 week(s).   Specialty:  Obstetrics and Gynecology Contact information: [address], [address], [address] [place] [address] [phone]        Brandon MelnickMORRIS, MARGARET R, MD .   Specialty:  Pediatrics          Newborn Data: Live born female  Birth Weight: 8 lb 15.4 oz (4065 g) APGAR: 9, 9  Home with mother.  Tadashi Burkel 05/27/2016, 8:14 AM

## 2016-05-27 NOTE — Lactation Note (Signed)
This note was copied from a baby's chart. Lactation Consultation Note  Patient Name: Alicia [name] [id number]'XToday's Date: 05/27/2016   Mom dressed and baby in car seat ready for discharge, baby 5647 hrs old.  Mom states baby is latching and feeding often.  Mom complaining of nipple soreness, denies blisters, cracks, or bruising.  Reviewed importance of a deep latch with a wide gape of mouth when breastfeeding.  Coconut oil given with instructions on use.  Encouraged STS and cue based feedings,Mom encouraged to feed baby 8-12 times/24 hours and with feeding cues. -12 per 24 hrs.  Baby using a pacifier in the car seat.  Talked about importance of baby breastfeeding when baby cues, and to avoid a pacifier for a few weeks. Reminded Mom of OP lactation services including [facility] on Tuesdays at 11 am.  Encouraged Mom to call prn for any assistance .Pediatrician follow up in 2 days.  Alicia [name], Alicia [name] 05/27/2016, 1:02 PM

## 2016-05-27 NOTE — Clinical Social Work Maternal (Signed)
  CLINICAL SOCIAL WORK MATERNAL/CHILD NOTE  Patient Details  Name: Alicia [name] MRN: [id number] Date of Birth: 02-20-1990  Date:  05/27/2016  Clinical Social Worker Initiating Note:  Laurey Arrow Date/ Time Initiated:  05/27/16/1152     Child's Name:  Alicia [name]   Legal Guardian:  Mother (FOB is Alicia [name])   Need for Interpreter:  None   Date of Referral:  05/26/16     Reason for Referral:  Behavioral Health Issues, including SI    Referral Source:  Central Nursery   Address:  [phone], [place] [address]  Phone number:  [phone]   Household Members:  Self, Significant Other   Natural Supports (not living in the home):  Immediate Family, Friends, Extended Family (FOB's immediate and extended family will also be a source of support. )   Professional Supports: None   Employment: Animator   Type of Work: Production assistant, radio   Education:  Database administrator Resources:  Multimedia programmer   Other Resources:      Cultural/Religious Considerations Which May Impact Care:  None Reported  Strengths:  Ability to meet basic needs , Engineer, materials , Home prepared for child , Understanding of illness   Risk Factors/Current Problems:  Mental Health Concerns    Cognitive State:  Able to Concentrate , Alert , Insightful , Linear Thinking    Mood/Affect:  Bright , Happy , Comfortable , Interested    CSW Assessment: CSW met with MOB to complete an assessment for a consult for hx of bipolar disorder.  MOB was inviting, polite, and interested in meeting with CSW.  MOB gave CSW permission to complete the assessment while MOB's mother Alicia [name]) and FOB were present. CSW inquired about MOB's supports, and MOB stated that MOB and FOB will receive a wealth of supports from their immediate and extended family members.  MOB expressed that MOB and FOB are excited about being new parents, and MOB feels like she is prepared for the  baby. CSW inquired about MOB's MH hx.  MOB acknowledged a MH hx however, MOB does not think MOB has a true diagnosis of bipolar disorder.  MOB plans to be re-evaluated in the near future.  CSW inquired about MOB's anger issues and MOB reported, "That was in the past and since counseling I have learned have more self-control.  CSW praised MOB for her improvements, insight, and awareness.  CSW educated MOB about PPD. CSW informed MOB of possible supports and interventions to decrease PPD.  CSW also encouraged MOB to seek medical attention if needed for increased signs or symptoms for PPD. CSW provided MOB with the hospital's support group flyer and encouraged MOB to attend.  CSW reviewed safe sleep and SIDS. MOB and FOB had numerous questions.  FOB acknowledged he smokes tobacco products and CSW processed what FOB could do to reduce the chances of SIDS. MOB asked appropriate question and responded appropriately to CSW questions.  MOB communicated that she has a crib for the baby, and has read information regarding SIDS. CSW provided MOB with CSW contact information and encouraged MOB to contact CSW with any additional questions or concerns.     CSW Plan/Description:  Information/Referral to [employer] , [employer] , No Further Intervention Required/No Barriers to Discharge   Laurey Arrow, MSW, LCSW Clinical Social Work [phone]   Dimple Nanas, LCSW 05/27/2016, 1:55 PM

## 2016-09-16 ENCOUNTER — Encounter: Payer: Self-pay | Admitting: Podiatry

## 2016-09-16 ENCOUNTER — Ambulatory Visit (INDEPENDENT_AMBULATORY_CARE_PROVIDER_SITE_OTHER): Payer: 59 | Admitting: Podiatry

## 2016-09-16 VITALS — BP 135/81 | HR 67

## 2016-09-16 DIAGNOSIS — L600 Ingrowing nail: Secondary | ICD-10-CM | POA: Diagnosis not present

## 2016-09-16 NOTE — Progress Notes (Signed)
   Subjective:    Patient ID: Alicia [name], female    DOB: 1990-05-28, 26 y.o.   MRN: [id number]  HPI 26 year old female presents the office they for concerns of ingrown total left big toe pointing to the lateral aspect. She states it has been drainage and there is redness and pain to the nail. She has noticed pus coming from the area. She has tried cutting out herself unable to do so. She's had no recent treatment for this otherwise. She is currently breast-feeding.   Review of Systems  Skin:       Change in nails       Objective:   Physical Exam General: AAO x3, NAD  Dermatological: There is significant incurvation present on the lateral of the left hallux toenail with localized edema and erythema around this area. There is no ascending cellulitis. No flexors or crepitus. Small amount of clear drainage is expressible able to identify any pus. There is no other lesions identified.  Vascular: Dorsalis Pedis artery and Posterior Tibial artery pedal pulses are 2/4 bilateral with immedate capillary fill time. Pedal hair growth present. There is no pain with calf compression, swelling, warmth, erythema.   Neruologic: Grossly intact via light touch bilateral. Protective threshold with Semmes Wienstein monofilament intact to all pedal sites bilateral.   Musculoskeletal: Tenderness to the left lateral hallux toenail but no other areas of tenderness identified today. No gross boney pedal deformities bilateral. No pain, crepitus, or limitation noted with foot and ankle range of motion bilateral. Muscular strength 5/5 in all groups tested bilateral.  Gait: Unassisted, Nonantalgic.     Assessment & Plan:  26 year old female with left lateral hallux and onychodystrophy with localized infection -Treatment options discussed including all alternatives, risks, and complications -Etiology of symptoms were discussed -At this time, recommended partial nail removal without chemical matricectomy to the  left lateral due to infection. Risks and complications were discussed with the patient for which they understand and written consent was obtained for the procedure. Under sterile conditions a total of 3 mL of of 2% lidocaine plain  was infiltrated in a hallux block fashion. Once anesthetized, the skin was prepped in sterile fashion. A tourniquet was then applied. Next the lateral border of the hallux nail border was sharply excised making sure to remove the entire offending nail border. Once the nail was  Removed, the area was debrided and the underlying skin was intact. The area was irrigated and hemostasis was obtained.  A dry sterile dressing was applied. After application of the dressing the tourniquet was removed and there is found to be an immediate capillary refill time to the digit. The patient tolerated the procedure well any complications. Post procedure instructions were discussed the patient for which he verbally understood. Follow-up in one week for nail check or sooner if any problems are to arise. Discussed signs/symptoms of worsening infection and directed to call the office immediately should any occur or go directly to the emergency room. In the meantime, encouraged to call the office with any questions, concerns, changes symptoms. -As appears the infection is localized recommend and antibiotic ointment and a dressing daily. If the redness is not improving leg scald it'll start antibiotics. As she is breast-feedingbe localized we will hold off on oral we can. She agrees to this.   Ovid Curd, DPM

## 2016-09-16 NOTE — Patient Instructions (Signed)

## 2016-09-19 DIAGNOSIS — L600 Ingrowing nail: Secondary | ICD-10-CM | POA: Insufficient documentation

## 2017-08-09 ENCOUNTER — Encounter: Payer: Self-pay | Admitting: General Practice

## 2017-08-16 ENCOUNTER — Other Ambulatory Visit: Payer: Self-pay | Admitting: Internal Medicine

## 2017-08-16 ENCOUNTER — Ambulatory Visit (INDEPENDENT_AMBULATORY_CARE_PROVIDER_SITE_OTHER): Payer: Managed Care, Other (non HMO) | Admitting: Internal Medicine

## 2017-08-16 ENCOUNTER — Encounter (INDEPENDENT_AMBULATORY_CARE_PROVIDER_SITE_OTHER): Payer: Self-pay

## 2017-08-16 ENCOUNTER — Encounter: Payer: Self-pay | Admitting: Internal Medicine

## 2017-08-16 VITALS — BP 108/68 | HR 74 | Temp 98.6°F | Ht 65.25 in | Wt 129.0 lb

## 2017-08-16 DIAGNOSIS — R0602 Shortness of breath: Secondary | ICD-10-CM

## 2017-08-16 DIAGNOSIS — F410 Panic disorder [episodic paroxysmal anxiety] without agoraphobia: Secondary | ICD-10-CM

## 2017-08-16 DIAGNOSIS — R110 Nausea: Secondary | ICD-10-CM | POA: Diagnosis not present

## 2017-08-16 DIAGNOSIS — E559 Vitamin D deficiency, unspecified: Secondary | ICD-10-CM

## 2017-08-16 DIAGNOSIS — R42 Dizziness and giddiness: Secondary | ICD-10-CM

## 2017-08-16 DIAGNOSIS — R251 Tremor, unspecified: Secondary | ICD-10-CM | POA: Diagnosis not present

## 2017-08-16 DIAGNOSIS — R51 Headache: Secondary | ICD-10-CM

## 2017-08-16 DIAGNOSIS — R519 Headache, unspecified: Secondary | ICD-10-CM

## 2017-08-16 LAB — COMPREHENSIVE METABOLIC PANEL
ALK PHOS: 73 U/L (ref 39–117)
ALT: 13 U/L (ref 0–35)
AST: 19 U/L (ref 0–37)
Albumin: 4.4 g/dL (ref 3.5–5.2)
BILIRUBIN TOTAL: 0.3 mg/dL (ref 0.2–1.2)
BUN: 11 mg/dL (ref 6–23)
CHLORIDE: 106 meq/L (ref 96–112)
CO2: 27 mEq/L (ref 19–32)
Calcium: 9.2 mg/dL (ref 8.4–10.5)
Creatinine, Ser: 0.76 mg/dL (ref 0.40–1.20)
GFR: 96.32 mL/min (ref >60.00)
Glucose, Bld: 91 mg/dL (ref 70–99)
POTASSIUM: 4 meq/L (ref 3.5–5.1)
SODIUM: 140 meq/L (ref 135–145)
Total Protein: 7.3 g/dL (ref 6.0–8.3)

## 2017-08-16 LAB — CBC
HCT: 35.9 % — ABNORMAL LOW (ref 36.0–46.0)
Hemoglobin: 12.3 g/dL (ref 12.0–15.0)
MCHC: 34.3 g/dL (ref 30.0–36.0)
MCV: 90.8 fl (ref 78.0–100.0)
Platelets: 310 10*3/uL (ref 150.0–400.0)
RBC: 3.95 Mil/uL (ref 3.87–5.11)
RDW: 13.9 % (ref 11.5–15.5)
WBC: 7.6 10*3/uL (ref 4.0–10.5)

## 2017-08-16 LAB — VITAMIN B12: VITAMIN B 12: 459 pg/mL (ref 211–911)

## 2017-08-16 LAB — TSH: TSH: 1.98 u[IU]/mL (ref 0.35–4.50)

## 2017-08-16 LAB — VITAMIN D 25 HYDROXY (VIT D DEFICIENCY, FRACTURES): VITD: 19.09 ng/mL — ABNORMAL LOW (ref 30.00–100.00)

## 2017-08-16 MED ORDER — VITAMIN D (ERGOCALCIFEROL) 1.25 MG (50000 UNIT) PO CAPS: 50000.0000 [IU] | ORAL_CAPSULE | ORAL | 0 refills | Status: DC

## 2017-08-16 NOTE — Progress Notes (Signed)
HPI  Pt presents to the clinic today to establish care and for management of the conditions listed below. She has not had a PCP in many years.  Childhood Asthma: This has not affected her as an adult. She does not use an inhalers.  Frequent Headaches: These occur about 3 x week. They are located in her forehead. She describes the pain as sharp and stabbing. She denies visual changes, but has sensitivity to light and sound. She denies dizziness, nausea or vomiting. She takes Tylenol and Excedrin Migraine as needed with good relief. She has never seen neurology.  Mood Disorder: She used to have severe anger issues. She has seen a psychiatrist in the past, diagnosed with possible bipolar, but never treated. She denies SI/HI.  She also "shaking spells". She reports she has had these intermittently for years. It starts out as feeling shaky (inside) no evidence of shaking in the extremities and she feels like she can't get enough oxygen. She feels nauseous, but does not vomit. She feels weak, lightheaded and dizzy. She has not passed out due to this. She is able to sit down for 15-20 minutes until she calms down but will take 4-5 hours for the symptoms to completely resolve. She has checked her vital signs during the events and reports her vitals are normal. She is not sure what triggers this, it occurs randomly. She does not feel anxious or overwhelmed. She denies depression, SI/HI.  Flu: never Tetanus: unsure Pap Smear: 07/2017 Dentist: as needed  Past Medical History:  Diagnosis Date  . Childhood asthma   . Frequent headaches   . Headache   . Medical history non-contributory   . 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 BIOTIN PO Take 2 each by mouth daily.    . Ferrous Sulfate (IRON) 325 (65 Fe) MG TABS Take by mouth.    Colleen Can. JUNEL 1/20 1-20 MG-MCG tablet Take 1 tablet by mouth  daily.  11   No current facility-administered medications for this visit.     No Known Allergies  Family History  Problem Relation Age of Onset  . Hypertension Mother   . Diabetes Maternal Grandmother   . Hyperlipidemia Maternal Grandmother   . Other Cousin        duplicate 15 Q syndrome  . Asthma Maternal Grandfather   . COPD Maternal Grandfather   . Hyperlipidemia Maternal Grandfather   . Dementia Paternal Grandmother   . Alzheimer's disease Paternal Grandfather     Social History   Socioeconomic History  . Marital status: Single    Spouse name: Not on file  . Number of children: Not on file  . Years of education: Not on file  . Highest education level: Not on file  Occupational History  . Not on file  Social Needs  . Financial resource strain: Not on file  . Food insecurity:    Worry: Not on file    Inability: Not on file  . Transportation needs:    Medical: Not on file    Non-medical: Not on file  Tobacco Use  . Smoking status: Never Smoker  . Smokeless tobacco: Never Used  Substance and Sexual Activity  . Alcohol use: Yes    Comment: rare  . Drug use: No  . Sexual activity: Yes    Birth control/protection: None  Lifestyle  . Physical activity:  Days per week: Not on file    Minutes per session: Not on file  . Stress: Not on file  Relationships  . Social connections:    Talks on phone: Not on file    Gets together: Not on file    Attends religious service: Not on file    Active member of club or organization: Not on file    Attends meetings of clubs or organizations: Not on file    Relationship status: Not on file  . Intimate partner violence:    Fear of current or ex partner: Not on file    Emotionally abused: Not on file    Physically abused: Not on file    Forced sexual activity: Not on file  Other Topics Concern  . Not on file  Social History Narrative  . Not on file    ROS:  Constitutional: Pt reports intermittent headaches. Denies  fever, malaise, fatigue, or abrupt weight changes.  HEENT: Denies eye pain, eye redness, ear pain, ringing in the ears, wax buildup, runny nose, nasal congestion, bloody nose, or sore throat. Respiratory: Pt reports intermittent shortness of breath. Denies difficulty breathing, cough or sputum production.   Cardiovascular: Denies chest pain, chest tightness, palpitations or swelling in the hands or feet.  Gastrointestinal: Pt reports intermittent nausea. Denies abdominal pain, bloating, constipation, diarrhea or blood in the stool.  GU: Denies frequency, urgency, pain with urination, blood in urine, odor or discharge. Musculoskeletal: Denies decrease in range of motion, difficulty with gait, muscle pain or joint pain and swelling.  Skin: Denies redness, rashes, lesions or ulcercations.  Neurological: Pt reports intermittent lightheadedness, dizziness. Denies dizziness, difficulty with memory, difficulty with speech or problems with balance and coordination.  Psych: Pt has a history of anger issues. Denies anxiety, depression, SI/HI.  No other specific complaints in a complete review of systems (except as listed in HPI above).  PE:  BP 108/68   Pulse 74   Temp 98.6 F (37 C) (Oral)   Ht 5' 5.25" (1.657 m)   Wt 129 lb (58.5 kg)   LMP 08/15/2017   SpO2 98%   BMI 21.30 kg/m   Wt Readings from Last 3 Encounters:  08/16/17 129 lb (58.5 kg)  05/17/16 163 lb (73.9 kg)  03/17/16 148 lb 1.9 oz (67.2 kg)    General: Appears her stated age, well developed, well nourished in NAD. Skin: Dry and intact. Neck: Neck supple, trachea midline. No masses, lumps or thyromegaly present.  Cardiovascular: Normal rate and rhythm. S1,S2 noted.  No murmur, rubs or gallops noted. No JVD or BLE edema.  Pulmonary/Chest: Normal effort and positive vesicular breath sounds. No respiratory distress. No wheezes, rales or ronchi noted.  Abdomen: Soft and nontender. Normal bowel sounds. Neurological: Alert and  oriented.  Psychiatric: Mood and affect mildly flat. Behavior is normal. Judgment and thought content normal.     Assessment and Plan:  Shaking, Nausea, SOB, Lightheadedness:  Likely panic disorder Will check CBC, CMET, TSH, B12 and Vit D today Consider trial of Fluoxetine for anger issues/panic disorder if labs normal  Make an appt for your annual exam. Nicki Reaper, NP

## 2017-08-16 NOTE — Assessment & Plan Note (Signed)
Discussed identifying triggers and avoid them Continue Tylenol and Execedrin for now Consider preventative medication if headaches continue but want to get panic disorder under control first

## 2017-08-16 NOTE — Patient Instructions (Signed)
Panic Attacks Panic attacks are sudden, short feelings of great fear or discomfort. You may have them for no reason when you are relaxed, when you are uneasy (anxious), or when you are sleeping. Follow these instructions at home:  Take all your medicines as told.  Check with your doctor before starting new medicines.  Keep all doctor visits. Contact a doctor if:  You are not able to take your medicines as told.  Your symptoms do not get better.  Your symptoms get worse. Get help right away if:  Your attacks seem different than your normal attacks.  You have thoughts about hurting yourself or others.  You take panic attack medicine and you have a side effect. This information is not intended to replace advice given to you by your health care provider. Make sure you discuss any questions you have with your health care provider. Document Released: 02/12/2010 Document Revised: 06/18/2015 Document Reviewed: 08/24/2012 Elsevier Interactive Patient Education  2017 Elsevier Inc.  

## 2017-08-16 NOTE — Progress Notes (Signed)
Vit  

## 2017-08-21 ENCOUNTER — Telehealth: Payer: Self-pay

## 2017-08-21 NOTE — Telephone Encounter (Signed)
Advised patient of lab results. I was not able to attach it to the lab result note. Patient does agree to try Lexapro 10 mg daily please. Patient was advised this will be sent in for her. Thank Neta Mendsyou-Rumaldo Difatta V Tahirah Sara, RMA

## 2017-08-23 ENCOUNTER — Other Ambulatory Visit: Payer: Self-pay | Admitting: Internal Medicine

## 2017-08-23 MED ORDER — ESCITALOPRAM OXALATE 10 MG PO TABS: 10.0000 mg | ORAL_TABLET | Freq: Every day | ORAL | 1 refills | Status: DC

## 2017-08-23 NOTE — Telephone Encounter (Signed)
Rx sent through e[facility]scribe  

## 2017-08-23 NOTE — Addendum Note (Signed)
Addended by: Roena MaladyEVONTENNO, Luddie Boghosian Y on: 08/23/2017 09:35 AM   Modules accepted: Orders

## 2017-08-23 NOTE — Telephone Encounter (Signed)
Pt is still waiting on the Lexapro 10 mg to be sent to her pharmacy.  [id number] [place], [place] [address] [phone] (Phone) [phone] (Fax)

## 2017-08-29 ENCOUNTER — Ambulatory Visit: Payer: Self-pay | Admitting: Internal Medicine

## 2017-08-29 NOTE — Telephone Encounter (Signed)
Left message on voicemail.

## 2017-08-29 NOTE — Telephone Encounter (Signed)
Ok, she can go ahead and stop it. Once it is completely out of her system, if she wants to try another medication, she can let me know. If not, we can refer to psychiatry.

## 2017-08-29 NOTE — Telephone Encounter (Signed)
Pt calling to state that she is having an upset stomach from escitalopram (LEXAPRO) 10 MG tablet [[id number]][[id number]]. Pt stomach has been upset since 08/25/17 please advise  Patient started Lexapro last Wednesday- headache Thursday morning and diarrhea started Friday. Patient normally takes Lexapro in am- patient took it today- patient is not taking anything to try to control her diarrhea. Patient is at work now and she can not take calls- please call her number and leave her a message- she will call you back- [phone]. Please let patient know how to stop medication. Reason for Disposition . Caller has URGENT medication question about med that PCP prescribed and triager unable to answer question  Answer Assessment - Initial Assessment Questions 1. DIARRHEA SEVERITY: "How bad is the diarrhea?" "How many extra stools have you had in the past 24 hours than normal?"    - NO DIARRHEA (SCALE 0)   - MILD (SCALE 1-3): Few loose or mushy BMs; increase of 1-3 stools over normal daily number of stools; mild increase in ostomy output.   -  MODERATE (SCALE 4-7): Increase of 4-6 stools daily over normal; moderate increase in ostomy output. * SEVERE (SCALE 8-10; OR 'WORST POSSIBLE'): Increase of 7 or more stools daily over normal; moderate increase in ostomy output; incontinence.     Every hour she goes to bathroom with upset stomach 2. ONSET: "When did the diarrhea begin?"      Friday night 3. BM CONSISTENCY: "How loose or watery is the diarrhea?"      watery since Friday night 4. VOMITING: "Are you also vomiting?" If so, ask: "How many times in the past 24 hours?"      no 5. ABDOMINAL PAIN: "Are you having any abdominal pain?" If yes: "What does it feel like?" (e.g., crampy, dull, intermittent, constant)      When patient has to go- she stomach is tender to touch 6. ABDOMINAL PAIN SEVERITY: If present, ask: "How bad is the pain?"  (e.g., Scale 1-10; mild, moderate, or severe)   - MILD (1-3): doesn't interfere  with normal activities, abdomen soft and not tender to touch    - MODERATE (4-7): interferes with normal activities or awakens from sleep, tender to touch    - SEVERE (8-10): excruciating pain, doubled over, unable to do any normal activities       Moderate- it makes her nauseous 7. ORAL INTAKE: If vomiting, "Have you been able to drink liquids?" "How much fluids have you had in the past 24 hours?"     Patient has been drinking and eating, 96 oz today 8. HYDRATION: "Any signs of dehydration?" (e.g., dry mouth [not just dry lips], too weak to stand, dizziness, new weight loss) "When did you last urinate?"     Yesterday patient felt weak and shaky- she works in [employer],  1 hour- lighter yellow 9. EXPOSURE: "Have you traveled to a foreign country recently?" "Have you been exposed to anyone with diarrhea?" "Could you have eaten any food that was spoiled?"     Patient does not think so 10. ANTIBIOTIC USE: "Are you taking antibiotics now or have you taken antibiotics in the past 2 months?"       no 11. OTHER SYMPTOMS: "Do you have any other symptoms?" (e.g., fever, blood in stool)       No- patient has slight headache in morning 12. PREGNANCY: "Is there any chance you are pregnant?" "When was your last menstrual period?"       No- LMP- 08/16/17  Protocols used: MEDICATION QUESTION CALL-A-AH, DIARRHEA-A-AH

## 2017-08-30 NOTE — Telephone Encounter (Signed)
Pt. Given message from Ms.Baity. Verbalizes understanding.

## 2017-09-13 ENCOUNTER — Encounter (HOSPITAL_COMMUNITY): Payer: Self-pay | Admitting: Emergency Medicine

## 2017-09-13 ENCOUNTER — Ambulatory Visit (HOSPITAL_COMMUNITY)
Admission: EM | Admit: 2017-09-13 | Discharge: 2017-09-13 | Disposition: A | Discharge status: Home / Self Care | Payer: Managed Care, Other (non HMO) | Attending: Family Medicine | Admitting: Family Medicine

## 2017-09-13 ENCOUNTER — Other Ambulatory Visit: Payer: Self-pay

## 2017-09-13 DIAGNOSIS — H5711 Ocular pain, right eye: Secondary | ICD-10-CM

## 2017-09-13 DIAGNOSIS — S0501XA Injury of conjunctiva and corneal abrasion without foreign body, right eye, initial encounter: Secondary | ICD-10-CM

## 2017-09-13 MED ORDER — OFLOXACIN 0.3 % OP SOLN: OPHTHALMIC | 0 refills | Status: DC

## 2017-09-13 MED ORDER — POLYETHYL GLYCOL-PROPYL GLYCOL 0.4-0.3 % OP GEL: 1.0000 "application " | Freq: Every evening | OPHTHALMIC | 0 refills | Status: DC | PRN

## 2017-09-13 NOTE — Discharge Instructions (Signed)
Small corneal abrasion to the 10 o'clock region of the right eye. Use ofloxacin eyedrops as directed on right eye. Artificial tear gel at night. Wait 10-15 minutes between drops, always use artificial tear gel last, as it prevents drops from penetrating through. Monitor for any worsening of symptoms, changes in vision, sensitivity to light, eye swelling, painful eye movement, follow up with ophthalmology for further evaluation. Otherwise, follow up with ophthalmology in 1-2 days for reevaluation and continued monitoring.

## 2017-09-13 NOTE — ED Provider Notes (Signed)
[facility]    CSN: [id number] Arrival date & time: 09/13/17  1927     History   Chief Complaint Chief Complaint  Patient presents with  . Eye Problem    HPI Robbie LisSamantha L Gacek is a 27 y.o. female.   27 year old female comes in for 2-day history of right eye pain, watering.  States child threw a cracker and hit her right eye.  She has foreign body sensation to the right upper quadrant.  Denies vision changes, photophobia.  Has had eye watering, but it is eye crusting in the morning.  Patient wears glasses, but denies contact lens use.  Has been applying over-the-counter eyedrops with mild relief.     Past Medical History:  Diagnosis Date  . Childhood asthma   . Frequent headaches   . Headache   . Medical history non-contributory   . 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 Frequent headaches 08/16/2017  . Panic disorder 08/16/2017    Past Surgical History:  Procedure Laterality Date  . CESAREAN SECTION N/A 05/25/2016   Procedure: CESAREAN SECTION;  Surgeon: Mitchel HonourMegan Morris, DO;  Location: [facility];  Service: Obstetrics;  Laterality: N/A;  PRIMARY EDC 05/27/16 NKDA Odelia GageHeather K, RNFA  . NO PAST SURGERIES      OB History    Gravida  1   Para  1   Term  1   Preterm      AB      Living  1     SAB      TAB      Ectopic      Multiple  0   Live Births  1            Home Medications    Prior to Admission medications   Medication Sig Start Date End Date Taking? Authorizing Provider  BIOTIN PO Take 2 each by mouth daily.   Yes [provider]  Ferrous Sulfate (IRON) 325 (65 Fe) MG TABS Take by mouth.   Yes [provider]  JUNEL 1/20 1-20 MG-MCG tablet Take 1 tablet by mouth daily. 08/07/17  Yes [provider]  Vitamin D, Ergocalciferol, (DRISDOL) 50000 units CAPS capsule Take 1 capsule (50,000 Units total)  by mouth every 7 (seven) days. 08/16/17  Yes Baity, Salvadore Oxfordegina W, NP  ofloxacin (OCUFLOX) 0.3 % ophthalmic solution 1 drop every 4 hours for the first 2 days, then 4 times a day for the next 5 days. 09/13/17   Cathie HoopsYu, Yaslyn Cumby V, PA-C  Polyethyl Glycol-Propyl Glycol (SYSTANE) 0.4-0.3 % GEL ophthalmic gel Place 1 application into both eyes at bedtime as needed. 09/13/17   Belinda FisherYu, Seaton Hofmann V, PA-C    Family History Family History  Problem Relation Age of Onset  . Hypertension Mother   . Diabetes Maternal Grandmother   . Hyperlipidemia Maternal Grandmother   . Other Cousin        duplicate 15 Q syndrome  . Asthma Maternal Grandfather   . COPD Maternal Grandfather   . Hyperlipidemia Maternal Grandfather   . Dementia Paternal Grandmother   . Alzheimer's disease Paternal Grandfather     Social History Social History   Tobacco Use  . Smoking status: Never Smoker  . Smokeless tobacco: Never Used  Substance Use Topics  . Alcohol use: Yes    Comment: rare  . Drug use: No  Allergies   Patient has no known allergies.   Review of Systems Review of Systems  Reason unable to perform ROS: See HPI as above.     Physical Exam Triage Vital Signs ED Triage Vitals  Enc Vitals Group     BP 09/13/17 1941 119/71     Pulse Rate 09/13/17 1941 67     Resp 09/13/17 1941 18     Temp 09/13/17 1941 98.2 F (36.8 C)     Temp Source 09/13/17 1941 Oral     SpO2 09/13/17 1941 98 %     Weight --      Height --      Head Circumference --      Peak Flow --      Pain Score 09/13/17 1938 8     Pain Loc --      Pain Edu? --      Excl. in GC? --    No data found.  Updated Vital Signs BP 119/71 (BP Location: Right Arm)   Pulse 67   Temp 98.2 F (36.8 C) (Oral)   Resp 18   LMP 09/13/2017   SpO2 98%   Visual Acuity Right Eye Distance: 20/25 with glasses Left Eye Distance: 20/20 with glasses Bilateral Distance: with glasses 20/25  Right Eye Near:   Left Eye Near:    Bilateral Near:     Physical Exam    Constitutional: She is oriented to person, place, and time. She appears well-developed and well-nourished. No distress.  HENT:  Head: Normocephalic and atraumatic.  Eyes: Pupils are equal, round, and reactive to light. EOM and lids are normal. Lids are everted and swept, no foreign bodies found. Right conjunctiva is injected. Left conjunctiva is not injected.  No ciliary flush.   Fluorescein stain showed small corneal abrasion to the 10:00 region of the iris.  Negative Seidel sign.  Neurological: She is alert and oriented to person, place, and time.    UC Treatments / Results  Labs (all labs ordered are listed, but only abnormal results are displayed) Labs Reviewed - No data to display  EKG None  Radiology No results found.  Procedures Procedures (including critical care time)  Medications Ordered in UC Medications - No data to display  Initial Impression / Assessment and Plan / UC Course  I have reviewed the triage vital signs and the nursing notes.  Pertinent labs & imaging results that were available during my care of the patient were reviewed by me and considered in my medical decision making (see chart for details).     Patient with a small corneal abrasion to the 10:00 region of the right eye rash.  We will start ofloxacin, artificial tear gel.  Follow-up with ophthalmology for reevaluation, monitoring within 2 days.  Return precautions given.  Patient expresses understanding and agrees to plan.   Final Clinical Impressions(s) / UC Diagnoses   Final diagnoses:  Abrasion of right cornea, initial encounter    ED Prescriptions    Medication Sig Dispense Auth. Provider   ofloxacin (OCUFLOX) 0.3 % ophthalmic solution 1 drop every 4 hours for the first 2 days, then 4 times a day for the next 5 days. 5 mL Radley Barto V, PA-C   Polyethyl Glycol-Propyl Glycol (SYSTANE) 0.4-0.3 % GEL ophthalmic gel Place 1 application into both eyes at bedtime as needed. 1 Bottle Threasa AlphaYu, Shelley Cocke V, PA-C         Vennie Salsbury V, [place]PA-C 09/13/17 2005

## 2017-09-13 NOTE — ED Triage Notes (Signed)
Child hit patient's right eye with a cracker.  Right eye is painful, watery.  Denies blurry vision.  Patient wears eye glasses, does not wear contacts.

## 2017-10-09 ENCOUNTER — Encounter: Payer: Managed Care, Other (non HMO) | Admitting: Internal Medicine

## 2017-10-12 ENCOUNTER — Encounter: Payer: Self-pay | Admitting: Internal Medicine

## 2017-10-12 ENCOUNTER — Ambulatory Visit (INDEPENDENT_AMBULATORY_CARE_PROVIDER_SITE_OTHER): Payer: Managed Care, Other (non HMO) | Admitting: Internal Medicine

## 2017-10-12 VITALS — BP 108/72 | HR 85 | Temp 98.3°F | Ht 65.25 in | Wt 130.0 lb

## 2017-10-12 DIAGNOSIS — R05 Cough: Secondary | ICD-10-CM

## 2017-10-12 DIAGNOSIS — Z0000 Encounter for general adult medical examination without abnormal findings: Secondary | ICD-10-CM

## 2017-10-12 DIAGNOSIS — R300 Dysuria: Secondary | ICD-10-CM | POA: Diagnosis not present

## 2017-10-12 DIAGNOSIS — R059 Cough, unspecified: Secondary | ICD-10-CM

## 2017-10-12 LAB — POC URINALSYSI DIPSTICK (AUTOMATED)
Bilirubin, UA: NEGATIVE
GLUCOSE UA: NEGATIVE
Ketones, UA: NEGATIVE
Nitrite, UA: NEGATIVE
PROTEIN UA: POSITIVE — AB
SPEC GRAV UA: 1.02 (ref 1.010–1.025)
Urobilinogen, UA: 0.2 E.U./dL
pH, UA: 6 (ref 5.0–8.0)

## 2017-10-12 LAB — COMPREHENSIVE METABOLIC PANEL
ALBUMIN: 4.2 g/dL (ref 3.5–5.2)
ALT: 11 U/L (ref 0–35)
AST: 15 U/L (ref 0–37)
Alkaline Phosphatase: 69 U/L (ref 39–117)
BILIRUBIN TOTAL: 0.6 mg/dL (ref 0.2–1.2)
BUN: 7 mg/dL (ref 6–23)
CHLORIDE: 106 meq/L (ref 96–112)
CO2: 28 mEq/L (ref 19–32)
CREATININE: 0.84 mg/dL (ref 0.40–1.20)
Calcium: 9.2 mg/dL (ref 8.4–10.5)
GFR: 85.71 mL/min (ref >60.00)
Glucose, Bld: 83 mg/dL (ref 70–99)
Potassium: 4.2 mEq/L (ref 3.5–5.1)
SODIUM: 140 meq/L (ref 135–145)
TOTAL PROTEIN: 7.4 g/dL (ref 6.0–8.3)

## 2017-10-12 LAB — CBC
HCT: 37.1 % (ref 36.0–46.0)
Hemoglobin: 12.6 g/dL (ref 12.0–15.0)
MCHC: 33.9 g/dL (ref 30.0–36.0)
MCV: 90.8 fl (ref 78.0–100.0)
Platelets: 324 10*3/uL (ref 150.0–400.0)
RBC: 4.09 Mil/uL (ref 3.87–5.11)
RDW: 12.9 % (ref 11.5–15.5)
WBC: 8.1 10*3/uL (ref 4.0–10.5)

## 2017-10-12 LAB — LIPID PANEL
CHOL/HDL RATIO: 3
CHOLESTEROL: 148 mg/dL (ref 0–200)
HDL: 44.4 mg/dL (ref >39.00)
LDL Cholesterol: 86 mg/dL (ref 0–99)
NonHDL: 103.8
Triglycerides: 91 mg/dL (ref 0.0–149.0)
VLDL: 18.2 mg/dL (ref 0.0–40.0)

## 2017-10-12 LAB — HEMOGLOBIN A1C: Hgb A1c MFr Bld: 5.4 % (ref 4.6–6.5)

## 2017-10-12 MED ORDER — AMOXICILLIN-POT CLAVULANATE 875-125 MG PO TABS: 1.0000 | ORAL_TABLET | Freq: Two times a day (BID) | ORAL | 0 refills | Status: DC

## 2017-10-12 MED ORDER — PREDNISONE 10 MG PO TABS: ORAL_TABLET | ORAL | 0 refills | Status: DC

## 2017-10-12 NOTE — Patient Instructions (Signed)

## 2017-10-12 NOTE — Progress Notes (Signed)
Subjective:    Patient ID: Alicia [name], female    DOB: 07-06-1990, 27 y.o.   MRN: [id number]  HPI  Pt presents to the clinic today for her annual exam.  Flu: never Tetanus: unsure Pap Smear: 07/2017 Dentist: as needed  Diet: She does eat meat. She consumes fruits and veggies daily. She does eat fried foods. She drinks mostly water, soda. Exercise: None  Review of Systems  Past Medical History:  Diagnosis Date  . Childhood asthma   . Frequent headaches   . Headache   . Medical history non-contributory   . 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 BIOTIN PO Take 2 each by mouth daily.    . Ferrous Sulfate (IRON) 325 (65 Fe) MG TABS Take by mouth.    Colleen Can 1/20 1-20 MG-MCG tablet Take 1 tablet by mouth daily.  11  . ofloxacin (OCUFLOX) 0.3 % ophthalmic solution 1 drop every 4 hours for the first 2 days, then 4 times a day for the next 5 days. 5 mL 0  . Polyethyl Glycol-Propyl Glycol (SYSTANE) 0.4-0.3 % GEL ophthalmic gel Place 1 application into both eyes at bedtime as needed. 1 Bottle 0  . Vitamin D, Ergocalciferol, (DRISDOL) 50000 units CAPS capsule Take 1 capsule (50,000 Units total) by mouth every 7 (seven) days. 12 capsule 0   No current facility-administered medications for this visit.     No Known Allergies  Family History  Problem Relation Age of Onset  . Hypertension Mother   . Diabetes Maternal Grandmother   . Hyperlipidemia Maternal Grandmother   . Other Cousin        duplicate 15 Q syndrome  . Asthma Maternal Grandfather   . COPD Maternal Grandfather   . Hyperlipidemia Maternal Grandfather   . Dementia Paternal Grandmother   . Alzheimer's disease Paternal Grandfather     Social History   Socioeconomic History  . Marital status: Single    Spouse name: Not on file  . Number of children: Not on file  . Years of education: Not on  file  . Highest education level: Not on file  Occupational History  . Not on file  Social Needs  . Financial resource strain: Not on file  . Food insecurity:    Worry: Not on file    Inability: Not on file  . Transportation needs:    Medical: Not on file    Non-medical: Not on file  Tobacco Use  . Smoking status: Never Smoker  . Smokeless tobacco: Never Used  Substance and Sexual Activity  . Alcohol use: Yes    Comment: rare  . Drug use: No  . Sexual activity: Yes    Birth control/protection: Pill  Lifestyle  . Physical activity:    Days per week: Not on file    Minutes per session: Not on file  . Stress: Not on file  Relationships  . Social connections:    Talks on phone: Not on file    Gets together: Not on file    Attends religious service: Not on file    Active member of club or organization: Not on file    Attends meetings of clubs or organizations: Not on file    Relationship status: Not on file  . Intimate partner violence:    Fear of current or ex partner: Not on file  Emotionally abused: Not on file    Physically abused: Not on file    Forced sexual activity: Not on file  Other Topics Concern  . Not on file  Social History Narrative  . Not on file     Constitutional: Denies fever, malaise, fatigue, headache or abrupt weight changes.  HEENT: Denies eye pain, eye redness, ear pain, ringing in the ears, wax buildup, runny nose, nasal congestion, bloody nose, or sore throat. Respiratory: Pt reports cough. Denies difficulty breathing, shortness of breath, or sputum production.   Cardiovascular: Denies chest pain, chest tightness, palpitations or swelling in the hands or feet.  Gastrointestinal: Denies abdominal pain, bloating, constipation, diarrhea or blood in the stool.  GU: Pt reports dysuria. Denies urgency, frequency, burning sensation, blood in urine, odor or discharge. Musculoskeletal: Denies decrease in range of motion, difficulty with gait, muscle  pain or joint pain and swelling.  Skin: Denies redness, rashes, lesions or ulcercations.  Neurological: Denies dizziness, difficulty with memory, difficulty with speech or problems with balance and coordination.  Psych: Denies anxiety, depression, SI/HI.  No other specific complaints in a complete review of systems (except as listed in HPI above).     Objective:   Physical Exam   BP 108/72   Pulse 85   Temp 98.3 F (36.8 C) (Oral)   Ht 5' 5.25" (1.657 m)   Wt 130 lb (59 kg)   LMP 10/07/2017   SpO2 98%   BMI 21.47 kg/m  Wt Readings from Last 3 Encounters:  10/12/17 130 lb (59 kg)  08/16/17 129 lb (58.5 kg)  05/17/16 163 lb (73.9 kg)    General: Appears her stated age, well developed, well nourished in NAD. Skin: Warm, dry and intact.  HEENT: Head: normal shape and size; Eyes: sclera white, no icterus, conjunctiva pink, PERRLA and EOMs intact; Ears: Tm's gray and intact, normal light reflex;  Throat/Mouth: Teeth present, mucosa pink and moist, + PND, no exudate, lesions or ulcerations noted.  Neck:  Neck supple, trachea midline. No masses, lumps or thyromegaly present.  Cardiovascular: Normal rate and rhythm. S1,S2 noted.  No murmur, rubs or gallops noted. No JVD or BLE edema.  Pulmonary/Chest: Normal effort and positive vesicular breath sounds. No respiratory distress. No wheezes, rales or ronchi noted.  Abdomen: Soft and nontender. Normal bowel sounds. No distention or masses noted. Liver, spleen and kidneys non palpable. No CVA tenderness noted. Musculoskeletal: Strength 5/5 BUE/BLE. No difficulty with gait.  Neurological: Alert and oriented. Cranial nerves II-XII grossly intact. Coordination normal.  Psychiatric: Mood and affect normal. Behavior is normal. Judgment and thought content normal.     BMET    Component Value Date/Time   NA 140 08/16/2017 1503   K 4.0 08/16/2017 1503   CL 106 08/16/2017 1503   CO2 27 08/16/2017 1503   GLUCOSE 91 08/16/2017 1503   BUN 11  08/16/2017 1503   CREATININE 0.76 08/16/2017 1503   CALCIUM 9.2 08/16/2017 1503    Lipid Panel  No results found for: CHOL, TRIG, HDL, CHOLHDL, VLDL, LDLCALC  CBC    Component Value Date/Time   WBC 7.6 08/16/2017 1503   RBC 3.95 08/16/2017 1503   HGB 12.3 08/16/2017 1503   HCT 35.9 (L) 08/16/2017 1503   PLT 310.0 08/16/2017 1503   MCV 90.8 08/16/2017 1503   MCH 33.7 05/26/2016 0524   MCHC 34.3 08/16/2017 1503   RDW 13.9 08/16/2017 1503    Hgb A1C No results found for: HGBA1C  Assessment & Plan:   Preventative Health Maintenance:  She declines flu shot or tetanus booster today Pap smear UTD Encouraged her to consume a balanced diet and exercise regimen Advised her to see a dentist annually Will check CBC, CMET, Lipid profile today  Dysuria:   Urinalysis: 2+ leuks, 2+ blood Will send urine culture eRx for Augmentin BID x 7 days Push fluids Ok to take AZO OTC  Cough:  eRx for Pred Taper x 6 days  RTC in 1 year, sooner if needed Nicki Reaper, NP

## 2017-10-13 ENCOUNTER — Telehealth: Payer: Self-pay | Admitting: Internal Medicine

## 2017-10-13 NOTE — Telephone Encounter (Signed)
Pt's husband dropped off biometric form to be filled out. Placed in RX tower

## 2017-10-14 LAB — URINE CULTURE
MICRO NUMBER:: 91126366
SPECIMEN QUALITY:: ADEQUATE

## 2017-10-23 NOTE — Telephone Encounter (Signed)
Form has been faxed 3 times and fax log says it was received. I will mail original to pt

## 2017-11-09 ENCOUNTER — Other Ambulatory Visit: Payer: Self-pay | Admitting: Internal Medicine

## 2017-11-09 DIAGNOSIS — E559 Vitamin D deficiency, unspecified: Secondary | ICD-10-CM

## 2017-12-20 ENCOUNTER — Encounter: Payer: Self-pay | Admitting: Family Medicine

## 2017-12-20 ENCOUNTER — Ambulatory Visit (INDEPENDENT_AMBULATORY_CARE_PROVIDER_SITE_OTHER): Payer: Managed Care, Other (non HMO) | Admitting: Family Medicine

## 2017-12-20 VITALS — BP 112/76 | HR 71 | Temp 97.8°F | Ht 65.25 in | Wt 135.0 lb

## 2017-12-20 DIAGNOSIS — H6992 Unspecified Eustachian tube disorder, left ear: Secondary | ICD-10-CM

## 2017-12-20 MED ORDER — FLUTICASONE PROPIONATE 50 MCG/ACT NA SUSP: 2.0000 | Freq: Every day | NASAL | 6 refills | Status: DC

## 2017-12-20 NOTE — Patient Instructions (Addendum)
Good to see you today  I have sent in a prescription for a steroid nasal spray that I want you to use 2 sprays in each nostril at bedtime for the next 2 to 3 weeks.  You can use longer than that if needed  You can also use regular Sudafed from behind the pharmacy counter, generic is fine.  I would use one in the morning and 1 after lunch for ear pressure  You can also use Afrin type nasal spray 2 sprays in each nostril twice a day for 4 days maximum  For pain you can use ibuprofen 400 to 600 mg, 2-3 over-the-counter tablets, every 8-12 hours can also use warm compresses if no improvement in 5 to 7 days please follow-up  Eustachian Tube Dysfunction The eustachian tube connects the middle ear to the back of the nose. It regulates air pressure in the middle ear by allowing air to move between the ear and nose. It also helps to drain fluid from the middle ear space. When the eustachian tube does not function properly, air pressure, fluid, or both can build up in the middle ear. Eustachian tube dysfunction can affect one or both ears. What are the causes? This condition happens when the eustachian tube becomes blocked or cannot open normally. This may result from:  Ear infections.  Colds and other upper respiratory infections.  Allergies.  Irritation, such as from cigarette smoke or acid from the stomach coming up into the esophagus (gastroesophageal reflux).  Sudden changes in air pressure, such as from descending in an airplane.  Abnormal growths in the nose or throat, such as nasal polyps, tumors, or enlarged tissue at the back of the throat (adenoids).  What increases the risk? This condition may be more likely to develop in people who smoke and people who are overweight. Eustachian tube dysfunction may also be more likely to develop in children, especially children who have:  Certain birth defects of the mouth, such as cleft palate.  Large tonsils and adenoids.  What are the signs  or symptoms? Symptoms of this condition may include:  A feeling of fullness in the ear.  Ear pain.  Clicking or popping noises in the ear.  Ringing in the ear.  Hearing loss.  Loss of balance.  Symptoms may get worse when the air pressure around you changes, such as when you travel to an area of high elevation or fly on an airplane. How is this diagnosed? This condition may be diagnosed based on:  Your symptoms.  A physical exam of your ear, nose, and throat.  Tests, such as those that measure: ? The movement of your eardrum (tympanogram). ? Your hearing (audiometry).  How is this treated? Treatment depends on the cause and severity of your condition. If your symptoms are mild, you may be able to relieve your symptoms by moving air into ("popping") your ears. If you have symptoms of fluid in your ears, treatment may include:  Decongestants.  Antihistamines.  Nasal sprays or ear drops that contain medicines that reduce swelling (steroids).  In some cases, you may need to have a procedure to drain the fluid in your eardrum (myringotomy). In this procedure, a small tube is placed in the eardrum to:  Drain the fluid.  Restore the air in the middle ear space.  Follow these instructions at home:  Take over-the-counter and prescription medicines only as told by your health care provider.  Use techniques to help pop your ears as recommended by  your health care provider. These may include: ? Chewing gum. ? Yawning. ? Frequent, forceful swallowing. ? Closing your mouth, holding your nose closed, and gently blowing as if you are trying to blow air out of your nose.  Do not do any of the following until your health care provider approves: ? Travel to high altitudes. ? Fly in airplanes. ? Work in a Estate agentpressurized cabin or room. ? Scuba dive.  Keep your ears dry. Dry your ears completely after showering or bathing.  Do not smoke.  Keep all follow-up visits as told by your  health care provider. This is important. Contact a health care provider if:  Your symptoms do not go away after treatment.  Your symptoms come back after treatment.  You are unable to pop your ears.  You have: ? A fever. ? Pain in your ear. ? Pain in your head or neck. ? Fluid draining from your ear.  Your hearing suddenly changes.  You become very dizzy.  You lose your balance. This information is not intended to replace advice given to you by your health care provider. Make sure you discuss any questions you have with your health care provider. Document Released: 02/06/2015 Document Revised: 06/18/2015 Document Reviewed: 01/29/2014 Elsevier Interactive Patient Education  [employer]2018 [employer]

## 2017-12-20 NOTE — Progress Notes (Signed)
   Subjective:    Patient ID: Alicia LisSamantha L [name], female    DOB: 06/06/1990, 27 y.o.   MRN: [id number]  HPI This is a 27 yo female, accompanied by her baby and husband, who presents with ear pain x 4 days. Started on left side and now in both ears. Left > right, feels muffled. Taking sudafed PE without relief. Took two ibuprofen last night and used hydrogen peroxide without relief. No drainage. No fever. Last week had cold with runny nose and cough, those symptoms improved.    Past Medical History:  Diagnosis Date  . Childhood asthma   . Frequent headaches   . Headache   . Medical history non-contributory   . 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 CESAREAN SECTION N/A 05/25/2016   Procedure: CESAREAN SECTION;  Surgeon: Mitchel HonourMegan Morris, DO;  Location: [facility];  Service: Obstetrics;  Laterality: N/A;  PRIMARY EDC 05/27/16 NKDA Odelia GageHeather K, RNFA  . NO PAST SURGERIES     Family History  Problem Relation Age of Onset  . Hypertension Mother   . Diabetes Maternal Grandmother   . Hyperlipidemia Maternal Grandmother   . Other Cousin        duplicate 15 Q syndrome  . Asthma Maternal Grandfather   . COPD Maternal Grandfather   . Hyperlipidemia Maternal Grandfather   . Dementia Paternal Grandmother   . Alzheimer's disease Paternal Grandfather    Social History   Tobacco Use  . Smoking status: Never Smoker  . Smokeless tobacco: Never Used  Substance Use Topics  . Alcohol use: Yes    Comment: rare  . Drug use: No      Review of Systems Per HPI    Objective:   Physical Exam  Constitutional: She appears well-developed and well-nourished. No distress.  HENT:  Head: Normocephalic and atraumatic.  Right Ear: Tympanic membrane, external ear and ear canal normal. No tenderness.  Left Ear: External ear normal. No drainage or tenderness. A middle ear effusion is present.  Eyes:  Conjunctivae are normal.  Neck: Normal range of motion. Neck supple.  Cardiovascular: Normal rate, regular rhythm and normal heart sounds.  Pulmonary/Chest: Effort normal and breath sounds normal. No stridor. No respiratory distress. She has no wheezes. She has no rales.  Lymphadenopathy:    She has no cervical adenopathy.  Skin: Skin is warm. She is not diaphoretic.  Psychiatric: She has a normal mood and affect. Her behavior is normal. Judgment and thought content normal.  Vitals reviewed.    BP 112/76 (BP Location: Right Arm, Patient Position: Sitting, Cuff Size: Normal)   Pulse 71   Temp 97.8 F (36.6 C) (Oral)   Ht 5' 5.25" (1.657 m)   Wt 135 lb (61.2 kg)   LMP 11/26/2017   SpO2 100%   BMI 22.29 kg/m       Assessment & Plan:  1. Eustachian tube disorder, left - Provided written and verbal information regarding diagnosis and treatment. - additional symptomatic relief measures reviewed- ibuprofen, Afrin - RTC precautions reviewed - fluticasone (FLONASE) 50 MCG/ACT nasal spray; Place 2 sprays into both nostrils daily.  Dispense: 16 g; Refill: 6  Olean Reeeborah Rossetta Kama, FNP-BC  [facility]  12/20/2017 10:00 AM

## 2018-01-07 ENCOUNTER — Other Ambulatory Visit: Payer: Self-pay | Admitting: Internal Medicine

## 2018-01-07 DIAGNOSIS — E559 Vitamin D deficiency, unspecified: Secondary | ICD-10-CM

## 2018-01-18 ENCOUNTER — Other Ambulatory Visit: Payer: Self-pay

## 2018-01-18 ENCOUNTER — Encounter (HOSPITAL_COMMUNITY): Payer: Self-pay | Admitting: *Deleted

## 2018-01-18 ENCOUNTER — Inpatient Hospital Stay (HOSPITAL_COMMUNITY)
Admission: AD | Admit: 2018-01-18 | Discharge: 2018-01-18 | Disposition: A | Discharge status: Home / Self Care | Payer: Managed Care, Other (non HMO) | Attending: Obstetrics and Gynecology | Admitting: Obstetrics and Gynecology

## 2018-01-18 DIAGNOSIS — M7918 Myalgia, other site: Secondary | ICD-10-CM

## 2018-01-18 DIAGNOSIS — R1031 Right lower quadrant pain: Secondary | ICD-10-CM | POA: Diagnosis not present

## 2018-01-18 DIAGNOSIS — Z3202 Encounter for pregnancy test, result negative: Secondary | ICD-10-CM | POA: Insufficient documentation

## 2018-01-18 LAB — CBC
HCT: 38.4 % (ref 36.0–46.0)
Hemoglobin: 12.6 g/dL (ref 12.0–15.0)
MCH: 31.9 pg (ref 26.0–34.0)
MCHC: 32.8 g/dL (ref 30.0–36.0)
MCV: 97.2 fL (ref 80.0–100.0)
NRBC: 0 % (ref 0.0–0.2)
Platelets: 274 10*3/uL (ref 150–400)
RBC: 3.95 MIL/uL (ref 3.87–5.11)
RDW: 12.8 % (ref 11.5–15.5)
WBC: 7.8 10*3/uL (ref 4.0–10.5)

## 2018-01-18 LAB — URINALYSIS, ROUTINE W REFLEX MICROSCOPIC
Bilirubin Urine: NEGATIVE
Glucose, UA: NEGATIVE mg/dL
Hgb urine dipstick: NEGATIVE
Ketones, ur: NEGATIVE mg/dL
Leukocytes, UA: NEGATIVE
Nitrite: NEGATIVE
Protein, ur: NEGATIVE mg/dL
SPECIFIC GRAVITY, URINE: 1.034 — AB (ref 1.005–1.030)
pH: 5 (ref 5.0–8.0)

## 2018-01-18 LAB — POCT PREGNANCY, URINE: Preg Test, Ur: NEGATIVE

## 2018-01-18 NOTE — MAU Note (Signed)
Presents with c/o right lower abdominal pain that began last night @ 1800.  Took Tylenol this morning @ 0730, some relief.  Denies VB.  LMP 1st week of December, currently taking BCP's.

## 2018-01-18 NOTE — MAU Provider Note (Signed)
History     CSN: [id number]  Arrival date and time: 01/18/18 [id number]   First Provider Initiated Contact with Patient 01/18/18 1017      Chief Complaint  Patient presents with  . Abdominal Pain   HPI Alicia [name] 27 y.o. Client of [facility].  Comes to MAU today as she had RLQ pain this morning that caused her to bend over and have some sweating.  Unable to stand up straight for awhile.  Has a 4419 month old and had a C/S for that baby.  Thinking she is having right ovary pain.  Currently sitting in bed, very comfortable and talking.  Has used a warm pack on her right side.  Will be going to work today. Has been on birth control pills for the past 5 months.  Denies any nausea, vomiting or diarrhea.  Ate a hash brown this morning.   OB History    Gravida  1   Para  1   Term  1   Preterm      AB      Living  1     SAB      TAB      Ectopic      Multiple  0   Live Births  1           Past Medical History:  Diagnosis Date  . Childhood asthma   . Frequent headaches   . Headache   . Medical history non-contributory   . 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 CESAREAN SECTION N/A 05/25/2016   Procedure: CESAREAN SECTION;  Surgeon: Mitchel HonourMegan Morris, DO;  Location: [facility];  Service: Obstetrics;  Laterality: N/A;  PRIMARY EDC 05/27/16 NKDA Odelia GageHeather K, RNFA  . NO PAST SURGERIES      Family History  Problem Relation Age of Onset  . Hypertension Mother   . Diabetes Maternal Grandmother   . Hyperlipidemia Maternal Grandmother   . Other Cousin        duplicate 15 Q syndrome  . COPD Father   . Asthma Maternal Grandfather   . COPD Maternal Grandfather   . Hyperlipidemia Maternal Grandfather   . Dementia Paternal Grandmother   . Alzheimer's disease Paternal Grandfather     Social History   Tobacco Use  . Smoking status: Never Smoker  .  Smokeless tobacco: Never Used  Substance Use Topics  . Alcohol use: Yes    Comment: rare  . Drug use: No    Allergies:  Allergies  Allergen Reactions  . Lexapro [Escitalopram Oxalate] Diarrhea and Nausea Only    Headache    Medications Prior to Admission  Medication Sig Dispense Refill Last Dose  . BIOTIN PO Take 2 each by mouth daily.   Taking  . Ferrous Sulfate (IRON) 325 (65 Fe) MG TABS Take by mouth.   Taking  . fluticasone (FLONASE) 50 MCG/ACT nasal spray Place 2 sprays into both nostrils daily. 16 g 6   . JUNEL 1/20 1-20 MG-MCG tablet Take 1 tablet by mouth daily.  11 Taking    Review of Systems  Constitutional: Positive for diaphoresis. Negative for fever.  Gastrointestinal: Positive for abdominal pain. Negative for diarrhea, nausea and vomiting.  Genitourinary: Negative for dysuria, vaginal bleeding and vaginal discharge.   Physical Exam   Blood pressure 110/66, pulse 83, temperature 98.1 F (36.7 C),  temperature source Oral, resp. rate 18, height 5\' 5"  (1.651 m), weight 60 kg, SpO2 100 %, unknown if currently breastfeeding.  Physical Exam  Nursing note and vitals reviewed. Constitutional: She is oriented to person, place, and time. She appears well-developed and well-nourished.  HENT:  Head: Normocephalic.  Eyes: EOM are normal.  Neck: Neck supple.  Cardiovascular: Normal rate.  Respiratory: Effort normal.  GI: Soft. Bowel sounds are normal. She exhibits no distension. There is no abdominal tenderness. There is no rebound and no guarding.  No pain before palpation and with palpation, only has pain just inside the iliac crest near the groin on the right side.  Musculoskeletal: Normal range of motion.  Neurological: She is alert and oriented to person, place, and time.  Skin: Skin is warm and dry.  Psychiatric: She has a normal mood and affect.    MAU Course  Procedures Results for orders placed or performed during the hospital encounter of 01/18/18 (from the  past 24 hour(s))  Urinalysis, Routine w reflex microscopic     Status: Abnormal   Collection Time: 01/18/18  9:57 AM  Result Value Ref Range   Color, Urine YELLOW YELLOW   APPearance HAZY (A) CLEAR   Specific Gravity, Urine 1.034 (H) 1.005 - 1.030   pH 5.0 5.0 - 8.0   Glucose, UA NEGATIVE NEGATIVE mg/dL   Hgb urine dipstick NEGATIVE NEGATIVE   Bilirubin Urine NEGATIVE NEGATIVE   Ketones, ur NEGATIVE NEGATIVE mg/dL   Protein, ur NEGATIVE NEGATIVE mg/dL   Nitrite NEGATIVE NEGATIVE   Leukocytes, UA NEGATIVE NEGATIVE  Pregnancy, urine POC     Status: None   Collection Time: 01/18/18 10:00 AM  Result Value Ref Range   Preg Test, Ur NEGATIVE NEGATIVE  CBC     Status: None   Collection Time: 01/18/18 10:03 AM  Result Value Ref Range   WBC 7.8 4.0 - 10.5 K/uL   RBC 3.95 3.87 - 5.11 MIL/uL   Hemoglobin 12.6 12.0 - 15.0 g/dL   HCT 09.838.4 11.936.0 - 14.746.0 %   MCV 97.2 80.0 - 100.0 fL   MCH 31.9 26.0 - 34.0 pg   MCHC 32.8 30.0 - 36.0 g/dL   RDW 82.912.8 56.211.5 - 13.015.5 %   Platelets 274 150 - 400 K/uL   nRBC 0.0 0.0 - 0.2 %    MDM No pregnancy, no UTI, no signs of infection, physical exam shows that pain is just inside the hip bone on exam likely the psoas muscle.  Client seems very comfortable when lying in bed and only has tenderness when that area is palpated.  Did not think ultrasound is needed today as the pain is not near to the midline but is next to her hip bone on palpation.  Client seemed comfortable with this explanation and agreeable to discharge as she was feeling better.  Assessment and Plan  Musculoskeletal pain  Plan Note for work given to be able to sit periodically at work. Take ibuprofen 600 mg by mouth with food every 6 hours for the next 2-3 days. Return if you have worsening symptoms - especially fever, nausea, vomiting or diarrhea. Avoid lifting if possible. Continue to use heat to the area for relief if needed. Seek additional medical care for worsening symptoms.  Anastasios Melander L  Tekoa Hamor 01/18/2018, 10:31 AM

## 2018-01-18 NOTE — Discharge Instructions (Signed)
Take ibuprofen 600 mg by mouth with food every 6 hours for the next 2-3 days. Return if you have worsening symptoms - especially fever, nausea, vomiting or diarrhea. Avoid lifting if possible. Continue to use heat to the area for relief if needed.

## 2018-02-09 ENCOUNTER — Encounter: Payer: Self-pay | Admitting: Family Medicine

## 2018-02-09 ENCOUNTER — Ambulatory Visit (INDEPENDENT_AMBULATORY_CARE_PROVIDER_SITE_OTHER): Payer: Managed Care, Other (non HMO) | Admitting: Family Medicine

## 2018-02-09 VITALS — BP 100/60 | HR 103 | Temp 98.1°F | Ht 65.25 in | Wt 129.5 lb

## 2018-02-09 DIAGNOSIS — J101 Influenza due to other identified influenza virus with other respiratory manifestations: Secondary | ICD-10-CM

## 2018-02-09 DIAGNOSIS — R509 Fever, unspecified: Secondary | ICD-10-CM

## 2018-02-09 LAB — POC INFLUENZA A&B (BINAX/QUICKVUE)
Influenza A, POC: POSITIVE — AB
Influenza B, POC: NEGATIVE

## 2018-02-09 MED ORDER — GUAIFENESIN-CODEINE 100-10 MG/5ML PO SYRP: 5.0000 mL | ORAL_SOLUTION | Freq: Every evening | ORAL | 0 refills | Status: DC | PRN

## 2018-02-09 NOTE — Patient Instructions (Addendum)
Push fluids.  Rest.  Return to work on Monday.  Use ibuprofen 800 mg every 8 hours for pain and fever. Start cough suppressant at night.  Call if SOB or new fever late in illness.

## 2018-02-09 NOTE — Assessment & Plan Note (Signed)
Discussed symptomatic care.  Hydration, rest. Call if SOB, cough worsening or prolongued fever. Reviewed flu timeline. She has no health problems that put her at risk for complications  And she is out of ideal timeline for use, so we decided against use of tamiflu. Pt agreed. Discussed family prophylaxis. She was advised to not return to work until 24 hour after fever resolved on no antipyretics.   Cough suppressant rx sent in

## 2018-02-09 NOTE — Progress Notes (Signed)
Subjective:    Patient ID: Alicia [name], female    DOB: Jun 22, 1990, 28 y.o.   MRN: [id number]  Fever   This is a new problem. The problem has been gradually improving. The maximum temperature noted was 103 to 103.9 F. Associated symptoms include congestion, coughing, headaches and muscle aches. Pertinent negatives include no ear pain. She has tried acetaminophen for the symptoms.  Cough  This is a new problem. The current episode started in the past 7 days. The problem has been gradually worsening. The problem occurs every few minutes. The cough is non-productive. Associated symptoms include a fever and headaches. Pertinent negatives include no ear pain. The symptoms are aggravated by lying down. Risk factors: nonsmoker. The treatment provided no relief. There is no history of asthma, COPD or environmental allergies.    Exposure to flu   Hard to keep up with fluids.  Social History /Family History/Past Medical History reviewed in detail and updated in EMR if needed. Blood pressure 100/60, pulse (!) 103, temperature 98.1 F (36.7 C), temperature source Oral, height 5' 5.25" (1.657 m), weight 129 lb 8 oz (58.7 kg), SpO2 98 %, unknown if currently breastfeeding.   Review of Systems  Constitutional: Positive for fever.  HENT: Positive for congestion. Negative for ear pain.   Respiratory: Positive for cough.   Allergic/Immunologic: Negative for environmental allergies.  Neurological: Positive for headaches.       Objective:   Physical Exam Constitutional:      General: She is not in acute distress.    Appearance: She is well-developed. She is not ill-appearing or toxic-appearing.  HENT:     Head: Normocephalic.     Right Ear: Hearing, tympanic membrane, ear canal and external ear normal. Tympanic membrane is not erythematous, retracted or bulging.     Left Ear: Hearing, tympanic membrane, ear canal and external ear normal. Tympanic membrane is not erythematous, retracted or  bulging.     Nose: Mucosal edema and rhinorrhea present.     Right Sinus: No maxillary sinus tenderness or frontal sinus tenderness.     Left Sinus: No maxillary sinus tenderness or frontal sinus tenderness.     Mouth/Throat:     Pharynx: Uvula midline.  Eyes:     General: Lids are normal. Lids are everted, no foreign bodies appreciated.     Conjunctiva/sclera: Conjunctivae normal.     Pupils: Pupils are equal, round, and reactive to light.  Neck:     Musculoskeletal: Normal range of motion and neck supple.     Thyroid: No thyroid mass or thyromegaly.     Vascular: No carotid bruit.     Trachea: Trachea normal.  Cardiovascular:     Rate and Rhythm: Normal rate and regular rhythm.     Pulses: Normal pulses.     Heart sounds: Normal heart sounds, S1 normal and S2 normal. No murmur. No friction rub. No gallop.   Pulmonary:     Effort: Pulmonary effort is normal. No tachypnea or respiratory distress.     Breath sounds: Normal breath sounds. No decreased breath sounds, wheezing, rhonchi or rales.  Skin:    General: Skin is warm and dry.     Findings: No rash.  Neurological:     Mental Status: She is alert.  Psychiatric:        Mood and Affect: Mood is not anxious or depressed.        Speech: Speech normal.        Behavior: Behavior normal.  Behavior is cooperative.        Judgment: Judgment normal.           Assessment & Plan:

## 2018-03-29 ENCOUNTER — Encounter: Payer: Self-pay | Admitting: Internal Medicine

## 2018-03-29 ENCOUNTER — Ambulatory Visit (INDEPENDENT_AMBULATORY_CARE_PROVIDER_SITE_OTHER): Payer: Managed Care, Other (non HMO) | Admitting: Internal Medicine

## 2018-03-29 DIAGNOSIS — F411 Generalized anxiety disorder: Secondary | ICD-10-CM | POA: Diagnosis not present

## 2018-03-29 DIAGNOSIS — F410 Panic disorder [episodic paroxysmal anxiety] without agoraphobia: Secondary | ICD-10-CM | POA: Diagnosis not present

## 2018-03-29 MED ORDER — SERTRALINE HCL 25 MG PO TABS: 25.0000 mg | ORAL_TABLET | Freq: Every day | ORAL | 2 refills | Status: DC

## 2018-03-29 NOTE — Assessment & Plan Note (Signed)
Support offered today She declines referral for therapy at this time Failed Lexapro Will trial Sertraline 25 mg PO daily  Updated me via mychart in 6 weeks and let me know how you are doing

## 2018-03-29 NOTE — Assessment & Plan Note (Signed)
Support offered today She declines referral for therapy at this time Failed Lexapro Will trial Sertraline 25 mg PO daily  Updated me via mychart in 6 weeks and let me know how you are doing 

## 2018-03-29 NOTE — Progress Notes (Signed)
Subjective:    Patient ID: Alicia [name], female    DOB: 06-03-1990, 28 y.o.   MRN: [id number]  HPI  Pt presents to the clinic today to discuss anxiety and panic attacks. She reports she recently separated from her fiance. They have a 88 year old daughter together. She is having symptoms of anxiety, eye twitching. She denies depression, SI/HI. She has had anxiety/panic disorder in the past. She was treated with Lexapro. She had bad side effects so stopped med after 1 week. She is not seeing a therapist.  Review of Systems  Past Medical History:  Diagnosis Date  . Childhood asthma   . Frequent headaches   . Headache   . Medical history non-contributory   . 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 BIOTIN PO Take 2 each by mouth daily.    . Ferrous Sulfate (IRON) 325 (65 Fe) MG TABS Take by mouth.    Colleen Can 1/20 1-20 MG-MCG tablet Take 1 tablet by mouth daily.  11  . sertraline (ZOLOFT) 25 MG tablet Take 1 tablet (25 mg total) by mouth daily. 30 tablet 2   No current facility-administered medications for this visit.     Allergies  Allergen Reactions  . Lexapro [Escitalopram Oxalate] Diarrhea and Nausea Only    Headache    Family History  Problem Relation Age of Onset  . Hypertension Mother   . Diabetes Maternal Grandmother   . Hyperlipidemia Maternal Grandmother   . Other Cousin        duplicate 15 Q syndrome  . COPD Father   . Asthma Maternal Grandfather   . COPD Maternal Grandfather   . Hyperlipidemia Maternal Grandfather   . Dementia Paternal Grandmother   . Alzheimer's disease Paternal Grandfather     Social History   Socioeconomic History  . Marital status: Single    Spouse name: Not on file  . Number of children: Not on file  . Years of education: Not on file  . Highest education level: Not on file  Occupational History  . Not on file    Social Needs  . Financial resource strain: Not on file  . Food insecurity:    Worry: Not on file    Inability: Not on file  . Transportation needs:    Medical: Not on file    Non-medical: Not on file  Tobacco Use  . Smoking status: Never Smoker  . Smokeless tobacco: Never Used  Substance and Sexual Activity  . Alcohol use: Yes    Comment: rare  . Drug use: No  . Sexual activity: Yes    Birth control/protection: Pill  Lifestyle  . Physical activity:    Days per week: Not on file    Minutes per session: Not on file  . Stress: Not on file  Relationships  . Social connections:    Talks on phone: Not on file    Gets together: Not on file    Attends religious service: Not on file    Active member of club or organization: Not on file    Attends meetings of clubs or organizations: Not on file    Relationship status: Not on file  . Intimate partner violence:    Fear of current or ex partner: Not on file    Emotionally abused: Not on file    Physically abused: Not on  file    Forced sexual activity: Not on file  Other Topics Concern  . Not on file  Social History Narrative  . Not on file     Constitutional: Denies fever, malaise, fatigue, headache or abrupt weight changes.  Respiratory: Denies difficulty breathing, shortness of breath, cough or sputum production.   Cardiovascular: Denies chest pain, chest tightness, palpitations or swelling in the hands or feet.  Neurological: Denies dizziness, difficulty with memory, difficulty with speech or problems with balance and coordination.  Psych: Pt reports anxiety and panic attacks. Denies depression, SI/HI.  No other specific complaints in a complete review of systems (except as listed in HPI above).     Objective:   Physical Exam   BP 106/62   Pulse 72   Temp 98.3 F (36.8 C) (Oral)   Wt 133 lb (60.3 kg)   LMP 03/15/2018   SpO2 98%   BMI 21.96 kg/m  Wt Readings from Last 3 Encounters:  03/29/18 133 lb (60.3 kg)   02/09/18 129 lb 8 oz (58.7 kg)  01/18/18 132 lb 4 oz (60 kg)    General: Appears her stated age, well developed, well nourished in NAD. Cardiovascular: Normal rate and rhythm. S1,S2 noted.  No murmur, rubs or gallops noted.  Pulmonary/Chest: Normal effort and positive vesicular breath sounds. No respiratory distress. No wheezes, rales or ronchi noted.  Neurological: Alert and oriented.  Psychiatric: Mood and affect normal. Behavior is normal. Judgment and thought content normal.    BMET    Component Value Date/Time   NA 140 10/12/2017 1237   K 4.2 10/12/2017 1237   CL 106 10/12/2017 1237   CO2 28 10/12/2017 1237   GLUCOSE 83 10/12/2017 1237   BUN 7 10/12/2017 1237   CREATININE 0.84 10/12/2017 1237   CALCIUM 9.2 10/12/2017 1237    Lipid Panel     Component Value Date/Time   CHOL 148 10/12/2017 1237   TRIG 91.0 10/12/2017 1237   HDL 44.40 10/12/2017 1237   CHOLHDL 3 10/12/2017 1237   VLDL 18.2 10/12/2017 1237   LDLCALC 86 10/12/2017 1237    CBC    Component Value Date/Time   WBC 7.8 01/18/2018 1003   RBC 3.95 01/18/2018 1003   HGB 12.6 01/18/2018 1003   HCT 38.4 01/18/2018 1003   PLT 274 01/18/2018 1003   MCV 97.2 01/18/2018 1003   MCH 31.9 01/18/2018 1003   MCHC 32.8 01/18/2018 1003   RDW 12.8 01/18/2018 1003    Hgb A1C Lab Results  Component Value Date   HGBA1C 5.4 10/12/2017           Assessment & Plan:

## 2018-03-29 NOTE — Patient Instructions (Signed)

## 2018-04-20 ENCOUNTER — Other Ambulatory Visit: Payer: Self-pay | Admitting: Internal Medicine

## 2018-05-07 ENCOUNTER — Ambulatory Visit: Payer: Managed Care, Other (non HMO) | Admitting: Family Medicine

## 2018-05-08 ENCOUNTER — Other Ambulatory Visit: Payer: Self-pay

## 2018-05-08 ENCOUNTER — Ambulatory Visit (INDEPENDENT_AMBULATORY_CARE_PROVIDER_SITE_OTHER): Payer: Managed Care, Other (non HMO) | Admitting: Internal Medicine

## 2018-05-08 ENCOUNTER — Encounter: Payer: Self-pay | Admitting: Internal Medicine

## 2018-05-08 VITALS — BP 104/68 | HR 77 | Temp 98.1°F | Wt 135.0 lb

## 2018-05-08 DIAGNOSIS — M25511 Pain in right shoulder: Secondary | ICD-10-CM

## 2018-05-08 DIAGNOSIS — G8929 Other chronic pain: Secondary | ICD-10-CM

## 2018-05-08 MED ORDER — ORPHENADRINE CITRATE ER 100 MG PO TB12: 100.0000 mg | ORAL_TABLET | Freq: Two times a day (BID) | ORAL | 0 refills | Status: DC

## 2018-05-08 NOTE — Patient Instructions (Signed)
Shoulder Exercises Ask your health care provider which exercises are safe for you. Do exercises exactly as told by your health care provider and adjust them as directed. It is normal to feel mild stretching, pulling, tightness, or discomfort as you do these exercises, but you should stop right away if you feel sudden pain or your pain gets worse.Do not begin these exercises until told by your health care provider. Range of Motion Exercises        These exercises warm up your muscles and joints and improve the movement and flexibility of your shoulder. These exercises also help to relieve pain, numbness, and tingling. These exercises involve stretching your injured shoulder directly. Exercise A: Pendulum 1. Stand near a wall or a surface that you can hold onto for balance. 2. Bend at the waist and let your left / right arm hang straight down. Use your other arm to support you. Keep your back straight and do not lock your knees. 3. Relax your left / right arm and shoulder muscles, and move your hips and your trunk so your left / right arm swings freely. Your arm should swing because of the motion of your body, not because you are using your arm or shoulder muscles. 4. Keep moving your body so your arm swings in the following directions, as told by your health care provider: ? Side to side. ? Forward and backward. ? In clockwise and counterclockwise circles. 5. Continue each motion for __________ seconds, or for as long as told by your health care provider. 6. Slowly return to the starting position. Repeat __________ times. Complete this exercise __________ times a day. Exercise B:Flexion, Standing 1. Stand and hold a broomstick, a cane, or a similar object. Place your hands a little more than shoulder-width apart on the object. Your left / right hand should be palm-up, and your other hand should be palm-down. 2. Keep your elbow straight and keep your shoulder muscles relaxed. Push the stick  down with your healthy arm to raise your left / right arm in front of your body, and then over your head until you feel a stretch in your shoulder. ? Avoid shrugging your shoulder while you raise your arm. Keep your shoulder blade tucked down toward the middle of your back. 3. Hold for __________ seconds. 4. Slowly return to the starting position. Repeat __________ times. Complete this exercise __________ times a day. Exercise C: Abduction, Standing 1. Stand and hold a broomstick, a cane, or a similar object. Place your hands a little more than shoulder-width apart on the object. Your left / right hand should be palm-up, and your other hand should be palm-down. 2. While keeping your elbow straight and your shoulder muscles relaxed, push the stick across your body toward your left / right side. Raise your left / right arm to the side of your body and then over your head until you feel a stretch in your shoulder. ? Do not raise your arm above shoulder height, unless your health care provider tells you to do that. ? Avoid shrugging your shoulder while you raise your arm. Keep your shoulder blade tucked down toward the middle of your back. 3. Hold for __________ seconds. 4. Slowly return to the starting position. Repeat __________ times. Complete this exercise __________ times a day. Exercise D:Internal Rotation 1. Place your left / right hand behind your back, palm-up. 2. Use your other hand to dangle an exercise band, a towel, or a similar object over your shoulder.   Grasp the band with your left / right hand so you are holding onto both ends. 3. Gently pull up on the band until you feel a stretch in the front of your left / right shoulder. ? Avoid shrugging your shoulder while you raise your arm. Keep your shoulder blade tucked down toward the middle of your back. 4. Hold for __________ seconds. 5. Release the stretch by letting go of the band and lowering your hands. Repeat __________ times.  Complete this exercise __________ times a day. Stretching Exercises  These exercises warm up your muscles and joints and improve the movement and flexibility of your shoulder. These exercises also help to relieve pain, numbness, and tingling. These exercises are done using your healthy shoulder to help stretch the muscles of your injured shoulder. Exercise E: Corner Stretch (External Rotation and Abduction) 1. Stand in a doorway with one of your feet slightly in front of the other. This is called a staggered stance. If you cannot reach your forearms to the door frame, stand facing a corner of a room. 2. Choose one of the following positions as told by your health care provider: ? Place your hands and forearms on the door frame above your head. ? Place your hands and forearms on the door frame at the height of your head. ? Place your hands on the door frame at the height of your elbows. 3. Slowly move your weight onto your front foot until you feel a stretch across your chest and in the front of your shoulders. Keep your head and chest upright and keep your abdominal muscles tight. 4. Hold for __________ seconds. 5. To release the stretch, shift your weight to your back foot. Repeat __________ times. Complete this stretch __________ times a day. Exercise F:Extension, Standing 1. Stand and hold a broomstick, a cane, or a similar object behind your back. ? Your hands should be a little wider than shoulder-width apart. ? Your palms should face away from your back. 2. Keeping your elbows straight and keeping your shoulder muscles relaxed, move the stick away from your body until you feel a stretch in your shoulder. ? Avoid shrugging your shoulders while you move the stick. Keep your shoulder blade tucked down toward the middle of your back. 3. Hold for __________ seconds. 4. Slowly return to the starting position. Repeat __________ times. Complete this exercise __________ times a  day. Strengthening Exercises           These exercises build strength and endurance in your shoulder. Endurance is the ability to use your muscles for a long time, even after they get tired. Exercise G:External Rotation 1. Sit in a stable chair without armrests. 2. Secure an exercise band at elbow height on your left / right side. 3. Place a soft object, such as a folded towel or a small pillow, between your left / right upper arm and your body to move your elbow a few inches away (about 10 cm) from your side. 4. Hold the end of the band so it is tight and there is no slack. 5. Keeping your elbow pressed against the soft object, move your left / right forearm out, away from your abdomen. Keep your body steady so only your forearm moves. 6. Hold for __________ seconds. 7. Slowly return to the starting position. Repeat __________ times. Complete this exercise __________ times a day. Exercise H:Shoulder Abduction 1. Sit in a stable chair without armrests, or stand. 2. Hold a __________ weight in your   left / right hand, or hold an exercise band with both hands. 3. Start with your arms straight down and your left / right palm facing in, toward your body. 4. Slowly lift your left / right hand out to your side. Do not lift your hand above shoulder height unless your health care provider tells you that this is safe. ? Keep your arms straight. ? Avoid shrugging your shoulder while you do this movement. Keep your shoulder blade tucked down toward the middle of your back. 5. Hold for __________ seconds. 6. Slowly lower your arm, and return to the starting position. Repeat __________ times. Complete this exercise __________ times a day. Exercise I:Shoulder Extension 1. Sit in a stable chair without armrests, or stand. 2. Secure an exercise band to a stable object in front of you where it is at shoulder height. 3. Hold one end of the exercise band in each hand. Your palms should face each  other. 4. Straighten your elbows and lift your hands up to shoulder height. 5. Step back, away from the secured end of the exercise band, until the band is tight and there is no slack. 6. Squeeze your shoulder blades together as you pull your hands down to the sides of your thighs. Stop when your hands are straight down by your sides. Do not let your hands go behind your body. 7. Hold for __________ seconds. 8. Slowly return to the starting position. Repeat __________ times. Complete this exercise __________ times a day. Exercise J:Standing Shoulder Row 1. Sit in a stable chair without armrests, or stand. 2. Secure an exercise band to a stable object in front of you so it is at waist height. 3. Hold one end of the exercise band in each hand. Your palms should be in a thumbs-up position. 4. Bend each of your elbows to an "L" shape (about 90 degrees) and keep your upper arms at your sides. 5. Step back until the band is tight and there is no slack. 6. Slowly pull your elbows back behind you. 7. Hold for __________ seconds. 8. Slowly return to the starting position. Repeat __________ times. Complete this exercise __________ times a day. Exercise K:Shoulder Press-Ups 1. Sit in a stable chair that has armrests. Sit upright, with your feet flat on the floor. 2. Put your hands on the armrests so your elbows are bent and your fingers are pointing forward. Your hands should be about even with the sides of your body. 3. Push down on the armrests and use your arms to lift yourself off of the chair. Straighten your elbows and lift yourself up as much as you comfortably can. ? Move your shoulder blades down, and avoid letting your shoulders move up toward your ears. ? Keep your feet on the ground. As you get stronger, your feet should support less of your body weight as you lift yourself up. 4. Hold for __________ seconds. 5. Slowly lower yourself back into the chair. Repeat __________ times. Complete  this exercise __________ times a day. Exercise L: Wall Push-Ups 1. Stand so you are facing a stable wall. Your feet should be about one arm-length away from the wall. 2. Lean forward and place your palms on the wall at shoulder height. 3. Keep your feet flat on the floor as you bend your elbows and lean forward toward the wall. 4. Hold for __________ seconds. 5. Straighten your elbows to push yourself back to the starting position. Repeat __________ times. Complete this exercise __________ times   a day. This information is not intended to replace advice given to you by your health care provider. Make sure you discuss any questions you have with your health care provider. Document Released: 11/24/2004 Document Revised: 05/16/2017 Document Reviewed: 09/21/2014 Elsevier Interactive Patient Education  2019 [employer]  

## 2018-05-08 NOTE — Progress Notes (Signed)
Subjective:    Patient ID: Alicia [name], female    DOB: 1990/03/17, 28 y.o.   MRN: [id number]  HPI  Pt presents to the clinic today with c/o right shoulder pain. This started 1 week ago but has been an intermittent issue over the last 10 years. She describes the pain as achy and sore. The pain does not radiate. She reports some associated numbness in the fingers of her right hand but denies tingling or weakness. 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 She has never had her shoulder evaluated, never tried PT. She has take Ibuprofen OTC with minimal relief.  Review of Systems  Past Medical History:  Diagnosis Date  . Childhood asthma   . Frequent headaches   . Headache   . Medical history non-contributory   . 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 BIOTIN PO Take 2 each by mouth daily.    . Ferrous Sulfate (IRON) 325 (65 Fe) MG TABS Take by mouth.    Colleen Can. JUNEL 1/20 1-20 MG-MCG tablet Take 1 tablet by mouth daily.  11  . sertraline (ZOLOFT) 25 MG tablet TAKE 1 TABLET BY MOUTH EVERY DAY 90 tablet 1   No current facility-administered medications for this visit.     Allergies  Allergen Reactions  . Lexapro [Escitalopram Oxalate] Diarrhea and Nausea Only    Headache    Family History  Problem Relation Age of Onset  . Hypertension Mother   . Diabetes Maternal Grandmother   . Hyperlipidemia Maternal Grandmother   . Other Cousin        duplicate 15 Q syndrome  . COPD Father   . Asthma Maternal Grandfather   . COPD Maternal Grandfather   . Hyperlipidemia Maternal Grandfather   . Dementia Paternal Grandmother   . Alzheimer's disease Paternal Grandfather     Social History   Socioeconomic History  . Marital status: Single    Spouse name: Not on file  . Number of children: Not on file  . Years of  education: Not on file  . Highest education level: Not on file  Occupational History  . Not on file  Social Needs  . Financial resource strain: Not on file  . Food insecurity:    Worry: Not on file    Inability: Not on file  . Transportation needs:    Medical: Not on file    Non-medical: Not on file  Tobacco Use  . Smoking status: Never Smoker  . Smokeless tobacco: Never Used  Substance and Sexual Activity  . Alcohol use: Yes    Comment: rare  . Drug use: No  . Sexual activity: Yes    Birth control/protection: Pill  Lifestyle  . Physical activity:    Days per week: Not on file    Minutes per session: Not on file  . Stress: Not on file  Relationships  . Social connections:    Talks on phone: Not on file    Gets together: Not on file    Attends religious service: Not on file    Active member of club or organization: Not on file    Attends meetings of clubs or organizations: Not on file    Relationship status: Not on file  . Intimate partner violence:    Fear of current  or ex partner: Not on file    Emotionally abused: Not on file    Physically abused: Not on file    Forced sexual activity: Not on file  Other Topics Concern  . Not on file  Social History Narrative  . Not on file     Constitutional: Denies fever, malaise, fatigue, headache or abrupt weight changes.  Respiratory: Denies difficulty breathing, shortness of breath, cough or sputum production.   Cardiovascular: Denies chest pain, chest tightness, palpitations or swelling in the hands or feet.  Musculoskeletal: Pt reports right shoulder pain. Denies decrease in range of motion, difficulty with gait, or joint swelling.  Neurological: Pt reports numbness of right hand. Denies dizziness, difficulty with memory, difficulty with speech or problems with balance and coordination.    No other specific complaints in a complete review of systems (except as listed in HPI above).     Objective:   Physical Exam    BP 104/68   Pulse 77   Temp 98.1 F (36.7 C) (Oral)   Wt 135 lb (61.2 kg)   LMP 04/12/2018   SpO2 99%   BMI 22.29 kg/m  Wt Readings from Last 3 Encounters:  05/08/18 135 lb (61.2 kg)  03/29/18 133 lb (60.3 kg)  02/09/18 129 lb 8 oz (58.7 kg)    General: Appears her stated age, well developed, well nourished in NAD. Cardiovascular: Normal rate and rhythm. S1,S2 noted.  No murmur, rubs or gallops noted. Radial pulses 2+ bilaterally. Pulmonary/Chest: Normal effort and positive vesicular breath sounds. No respiratory distress. No wheezes, rales or ronchi noted.  Musculoskeletal: Normal internal and external rotation of the right shoulder. Pain with palpation of the posterior shoulder where scapula meets shoulder joint. Negative drop can test. Strength 5/5 BUE. Hand grips equal. Neurological: Alert and oriented.  Coordination normal. Negative Phalen. Negative Tinel.   BMET    Component Value Date/Time   NA 140 10/12/2017 1237   K 4.2 10/12/2017 1237   CL 106 10/12/2017 1237   CO2 28 10/12/2017 1237   GLUCOSE 83 10/12/2017 1237   BUN 7 10/12/2017 1237   CREATININE 0.84 10/12/2017 1237   CALCIUM 9.2 10/12/2017 1237    Lipid Panel     Component Value Date/Time   CHOL 148 10/12/2017 1237   TRIG 91.0 10/12/2017 1237   HDL 44.40 10/12/2017 1237   CHOLHDL 3 10/12/2017 1237   VLDL 18.2 10/12/2017 1237   LDLCALC 86 10/12/2017 1237    CBC    Component Value Date/Time   WBC 7.8 01/18/2018 1003   RBC 3.95 01/18/2018 1003   HGB 12.6 01/18/2018 1003   HCT 38.4 01/18/2018 1003   PLT 274 01/18/2018 1003   MCV 97.2 01/18/2018 1003   MCH 31.9 01/18/2018 1003   MCHC 32.8 01/18/2018 1003   RDW 12.8 01/18/2018 1003    Hgb A1C Lab Results  Component Value Date   HGBA1C 5.4 10/12/2017           Assessment & Plan:   Acute on Chronic Right Shoulder Pain:  Seems muscular in origin Will hold off on xray at this time Encouraged Ibuprofen 600 mg every 8 hours as needed  with food RX for Norflex 100 mg BID prn- sedation caution given Encouraged her to alternate heat and ice Shoulder exercises given- may need physical therapy if no improvement [employer] for [employer] form filled out- copy scanned into chart  Return precautions discussed

## 2018-05-11 ENCOUNTER — Ambulatory Visit (INDEPENDENT_AMBULATORY_CARE_PROVIDER_SITE_OTHER): Payer: Managed Care, Other (non HMO) | Admitting: Internal Medicine

## 2018-05-11 ENCOUNTER — Other Ambulatory Visit: Payer: Self-pay

## 2018-05-11 ENCOUNTER — Encounter: Payer: Self-pay | Admitting: Internal Medicine

## 2018-05-11 VITALS — Wt 135.0 lb

## 2018-05-11 DIAGNOSIS — J301 Allergic rhinitis due to pollen: Secondary | ICD-10-CM | POA: Diagnosis not present

## 2018-05-11 NOTE — Patient Instructions (Signed)

## 2018-05-11 NOTE — Progress Notes (Signed)
Virtual Visit via Video Note  I connected with Alicia [name] on 05/11/18 at  2:45 PM EDT by a video enabled telemedicine application and verified that I am speaking with the correct person using two identifiers.   I discussed the limitations of evaluation and management by telemedicine and the availability of in person appointments. The patient expressed understanding and agreed to proceed.  Patient Location: In her car at work Provider Location: Office  History of Present Illness:  Pt reports right ear pain, nasal congestion and cough. She reports this started 1 week ago but got worse yesterday. She describes the ear pain as pressure and fullness with some hearing loss. She denies ear drainage. She is blowing yellow mucous out of her nose. The cough is productive of yellow mucous. She denies headache, runny nose or sore throat. She has run fever up to 100.3 x 1, but denies chills or body aches. She has tried and OTC antihistamine and Ibuprofen with minimal relief. She has not had sick contacts. She has not really had seasonal allergies in the past.   Observations/Objective:  Alert and oriented x 3 NAD Obvious nasal congestion but no cough or SOB Face doesn't appear flushed, she denies sinus tenderness Behavior, judgement and thought content are normal  Assessment and Plan:  Allergic Rhinitis:  Encouraged her to take the antihistamine consistently for the next 7-10 days Start Flonase BID OTC If no improvement Monday, consider round of Amoxil 500 mgTID x 10 days  Follow Up Instructions:    I discussed the assessment and treatment plan with the patient. The patient was provided an opportunity to ask questions and all were answered. The patient agreed with the plan and demonstrated an understanding of the instructions.   The patient was advised to call back or seek an in-person evaluation if the symptoms worsen or if the condition fails to improve as anticipated.     Nicki Reaper, NP

## 2018-05-14 ENCOUNTER — Encounter: Payer: Self-pay | Admitting: Internal Medicine

## 2018-05-17 ENCOUNTER — Encounter: Payer: Self-pay | Admitting: Internal Medicine

## 2018-05-17 ENCOUNTER — Ambulatory Visit (INDEPENDENT_AMBULATORY_CARE_PROVIDER_SITE_OTHER): Payer: Managed Care, Other (non HMO) | Admitting: Internal Medicine

## 2018-05-17 DIAGNOSIS — F410 Panic disorder [episodic paroxysmal anxiety] without agoraphobia: Secondary | ICD-10-CM | POA: Diagnosis not present

## 2018-05-17 DIAGNOSIS — F411 Generalized anxiety disorder: Secondary | ICD-10-CM | POA: Diagnosis not present

## 2018-05-17 DIAGNOSIS — R4586 Emotional lability: Secondary | ICD-10-CM | POA: Diagnosis not present

## 2018-05-17 NOTE — Progress Notes (Signed)
Virtual Visit via Video Note  I connected with Alicia [name] on 05/17/18 at  3:45 PM EDT by a video enabled telemedicine application and verified that I am speaking with the correct person using two identifiers.   I discussed the limitations of evaluation and management by telemedicine and the availability of in person appointments. The patient expressed understanding and agreed to proceed.  History of Present Illness:  Pt wants to discuss headaches and mood swings. She reports the last 3 days, she has had a constant headache. The headache is located forehead. She describes the pain as pressure.  She denies dizziness, visual changes. She also reports moods changes from being happy and full of energy, to being depressed with negative thoughts, which causes anxiety. She also reports being easily annoyed, irritable and angry. She started on Sertraline 1 month ago and overall feels like her anxiety and panic attack are better. She has had < 3 panic attacks last month. She reports this is her placebo week on her OCP's and is not sure if that is related but she also reports her moods have never been this bad when she was on her placebo week before.    Past Medical History:  Diagnosis Date  . Childhood asthma   . Frequent headaches   . Headache   . Medical history non-contributory   . 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 BIOTIN PO Take 2 each by mouth daily.    . Ferrous Sulfate (IRON) 325 (65 Fe) MG TABS Take by mouth.    Colleen Can. JUNEL 1/20 1-20 MG-MCG tablet Take 1 tablet by mouth daily.  11  . orphenadrine (NORFLEX) 100 MG tablet Take 1 tablet (100 mg total) by mouth 2 (two) times daily. 20 tablet 0  . sertraline (ZOLOFT) 25 MG tablet TAKE 1 TABLET BY MOUTH EVERY DAY 90 tablet 1   No current facility-administered medications for this visit.     Allergies  Allergen Reactions   . Lexapro [Escitalopram Oxalate] Diarrhea and Nausea Only    Headache    Family History  Problem Relation Age of Onset  . Hypertension Mother   . Diabetes Maternal Grandmother   . Hyperlipidemia Maternal Grandmother   . Other Cousin        duplicate 15 Q syndrome  . COPD Father   . Asthma Maternal Grandfather   . COPD Maternal Grandfather   . Hyperlipidemia Maternal Grandfather   . Dementia Paternal Grandmother   . Alzheimer's disease Paternal Grandfather     Social History   Socioeconomic History  . Marital status: Single    Spouse name: Not on file  . Number of children: Not on file  . Years of education: Not on file  . Highest education level: Not on file  Occupational History  . Not on file  Social Needs  . Financial resource strain: Not on file  . Food insecurity:    Worry: Not on file    Inability: Not on file  . Transportation needs:    Medical: Not on file    Non-medical: Not on file  Tobacco Use  . Smoking status: Never Smoker  . Smokeless tobacco: Never Used  Substance and Sexual Activity  . Alcohol use: Yes    Comment: rare  . Drug use: No  . Sexual activity: Yes    Birth control/protection: Pill  Lifestyle  .  Physical activity:    Days per week: Not on file    Minutes per session: Not on file  . Stress: Not on file  Relationships  . Social connections:    Talks on phone: Not on file    Gets together: Not on file    Attends religious service: Not on file    Active member of club or organization: Not on file    Attends meetings of clubs or organizations: Not on file    Relationship status: Not on file  . Intimate partner violence:    Fear of current or ex partner: Not on file    Emotionally abused: Not on file    Physically abused: Not on file    Forced sexual activity: Not on file  Other Topics Concern  . Not on file  Social History Narrative  . Not on file     Constitutional: Pt reports headache. Denies fever, malaise, fatigue, or  abrupt weight changes.  HEENT: Denies eye pain, eye redness, ear pain, ringing in the ears, wax buildup, runny nose, nasal congestion, bloody nose, or sore throat. Respiratory: Denies difficulty breathing, shortness of breath, cough or sputum production.   Cardiovascular: Denies chest pain, chest tightness, palpitations or swelling in the hands or feet.  Musculoskeletal: Denies decrease in range of motion, difficulty with gait, muscle pain or joint pain and swelling.  Skin: Denies redness, rashes, lesions or ulcercations.  Neurological: Denies dizziness, difficulty with memory, difficulty with speech or problems with balance and coordination.  Psych: Pt reports anxiety and depression. Denies SI/HI.  No other specific complaints in a complete review of systems (except as listed in HPI above).  Wt Readings from Last 3 Encounters:  05/11/18 135 lb (61.2 kg)  05/08/18 135 lb (61.2 kg)  03/29/18 133 lb (60.3 kg)    General: Appears herstated age, well developed, well nourished in NAD. Skin: Warm, dry and intact.  Pulmonary/Chest: Normal effort. No respiratory distress.  Neurological: Alert and oriented.  Psychiatric: Mood and affect mildly flat. Behavior is normal. Judgment and thought content normal.    BMET    Component Value Date/Time   NA 140 10/12/2017 1237   K 4.2 10/12/2017 1237   CL 106 10/12/2017 1237   CO2 28 10/12/2017 1237   GLUCOSE 83 10/12/2017 1237   BUN 7 10/12/2017 1237   CREATININE 0.84 10/12/2017 1237   CALCIUM 9.2 10/12/2017 1237    Lipid Panel     Component Value Date/Time   CHOL 148 10/12/2017 1237   TRIG 91.0 10/12/2017 1237   HDL 44.40 10/12/2017 1237   CHOLHDL 3 10/12/2017 1237   VLDL 18.2 10/12/2017 1237   LDLCALC 86 10/12/2017 1237    CBC    Component Value Date/Time   WBC 7.8 01/18/2018 1003   RBC 3.95 01/18/2018 1003   HGB 12.6 01/18/2018 1003   HCT 38.4 01/18/2018 1003   PLT 274 01/18/2018 1003   MCV 97.2 01/18/2018 1003   MCH 31.9  01/18/2018 1003   MCHC 32.8 01/18/2018 1003   RDW 12.8 01/18/2018 1003    Hgb A1C Lab Results  Component Value Date   HGBA1C 5.4 10/12/2017         Assessment and Plan:  Acute Headache:  ? Tension Improved per her report Continue Ibuprofen prn  GAD, Panic Disorder:  Support offered today Increase Sertraline to 50 mg daily Update me via mychart in 2-3 weeks and let me know how you are doing  Follow Up Instructions:  I discussed the assessment and treatment plan with the patient. The patient was provided an opportunity to ask questions and all were answered. The patient agreed with the plan and demonstrated an understanding of the instructions.   The patient was advised to call back or seek an in-person evaluation if the symptoms worsen or if the condition fails to improve as anticipated.    Nicki Reaper, NP

## 2018-05-17 NOTE — Patient Instructions (Signed)

## 2018-05-23 ENCOUNTER — Telehealth: Payer: Self-pay | Admitting: Internal Medicine

## 2018-05-23 NOTE — Telephone Encounter (Signed)
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

## 2018-05-24 NOTE — Telephone Encounter (Signed)
Ok for note 

## 2018-05-25 NOTE — Telephone Encounter (Signed)
Note has been faxed.

## 2018-06-05 ENCOUNTER — Ambulatory Visit: Payer: Managed Care, Other (non HMO) | Admitting: Internal Medicine

## 2018-06-05 ENCOUNTER — Telehealth: Payer: Self-pay

## 2018-06-05 NOTE — Progress Notes (Signed)
Erroneous encounter

## 2018-06-05 NOTE — Telephone Encounter (Signed)
Copied from CRM [id number]. 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 Pt also stated she will be drug tested so she needs the Rx for sertraline (ZOLOFT) 25 MG tablet to reflect approval for her to take 2 sertraline (ZOLOFT) 25 MG tablets daily as she was instructed to do during the last conversation they had regarding increasing the medication.

## 2018-06-05 NOTE — Telephone Encounter (Signed)
A new Rx should not be needed, she just needs to show she has a prescription, instructions on use is not neccesary

## 2018-06-21 ENCOUNTER — Encounter: Payer: Self-pay | Admitting: Internal Medicine

## 2018-06-22 MED ORDER — SERTRALINE HCL 50 MG PO TABS: 50.0000 mg | ORAL_TABLET | Freq: Every day | ORAL | 0 refills | Status: DC

## 2018-06-25 ENCOUNTER — Encounter: Payer: Self-pay | Admitting: Internal Medicine

## 2018-06-25 DIAGNOSIS — F410 Panic disorder [episodic paroxysmal anxiety] without agoraphobia: Secondary | ICD-10-CM

## 2018-06-25 DIAGNOSIS — F411 Generalized anxiety disorder: Secondary | ICD-10-CM

## 2018-07-09 ENCOUNTER — Encounter: Payer: Self-pay | Admitting: Internal Medicine

## 2018-07-11 NOTE — Telephone Encounter (Signed)
Copied from [place] [id number]. Topic: Appointment Scheduling - Scheduling Inquiry for Clinic >> Jul 10, 2018  4:09 PM Reyne Dumas L wrote: Reason for CRM:   Pt states she needs to be seen because left index finger may be arthritic.

## 2018-07-12 ENCOUNTER — Ambulatory Visit (INDEPENDENT_AMBULATORY_CARE_PROVIDER_SITE_OTHER): Payer: Managed Care, Other (non HMO) | Admitting: Internal Medicine

## 2018-07-12 ENCOUNTER — Encounter: Payer: Self-pay | Admitting: Internal Medicine

## 2018-07-12 ENCOUNTER — Ambulatory Visit (INDEPENDENT_AMBULATORY_CARE_PROVIDER_SITE_OTHER)
Admission: RE | Admit: 2018-07-12 | Discharge: 2018-07-12 | Disposition: A | Discharge status: Home / Self Care | Payer: Managed Care, Other (non HMO) | Source: Ambulatory Visit | Attending: Internal Medicine | Admitting: Internal Medicine

## 2018-07-12 ENCOUNTER — Other Ambulatory Visit: Payer: Self-pay

## 2018-07-12 VITALS — BP 104/66 | HR 63 | Temp 98.3°F | Wt 137.0 lb

## 2018-07-12 DIAGNOSIS — N926 Irregular menstruation, unspecified: Secondary | ICD-10-CM

## 2018-07-12 DIAGNOSIS — M79645 Pain in left finger(s): Secondary | ICD-10-CM | POA: Diagnosis not present

## 2018-07-12 LAB — POCT URINE PREGNANCY: Preg Test, Ur: NEGATIVE

## 2018-07-12 NOTE — Progress Notes (Signed)
Subjective:    Patient ID: Alicia [name], female    DOB: 1990/12/28, 28 y.o.   MRN: [id number]  HPI  Pt reports pain in left index finger. This started 1 month ao. She describes the pain as sharp and stabbing, only occurs when she tries to pop her knuckles. She reports her knuckles in her left index finger will not pop but all the other knuckles will. She denies any swelling, bruising or injury to the area. She has taken Ibuprofen as needed with some relief.  Review of Systems      Past Medical History:  Diagnosis Date  . Childhood asthma   . Frequent headaches   . Headache   . Medical history non-contributory   . 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 BIOTIN PO Take 2 each by mouth daily.    . Ferrous Sulfate (IRON) 325 (65 Fe) MG TABS Take by mouth.    Colleen Can. JUNEL 1/20 1-20 MG-MCG tablet Take 1 tablet by mouth daily.  11  . orphenadrine (NORFLEX) 100 MG tablet Take 1 tablet (100 mg total) by mouth 2 (two) times daily. 20 tablet 0  . sertraline (ZOLOFT) 50 MG tablet Take 1 tablet (50 mg total) by mouth daily. 90 tablet 0   No current facility-administered medications for this visit.     Allergies  Allergen Reactions  . Lexapro [Escitalopram Oxalate] Diarrhea and Nausea Only    Headache    Family History  Problem Relation Age of Onset  . Hypertension Mother   . Diabetes Maternal Grandmother   . Hyperlipidemia Maternal Grandmother   . Other Cousin        duplicate 15 Q syndrome  . COPD Father   . Asthma Maternal Grandfather   . COPD Maternal Grandfather   . Hyperlipidemia Maternal Grandfather   . Dementia Paternal Grandmother   . Alzheimer's disease Paternal Grandfather     Social History   Socioeconomic History  . Marital status: Single    Spouse name: Not on file  . Number of children: Not on file  . Years of education: Not on file  . Highest  education level: Not on file  Occupational History  . Not on file  Social Needs  . Financial resource strain: Not on file  . Food insecurity    Worry: Not on file    Inability: Not on file  . Transportation needs    Medical: Not on file    Non-medical: Not on file  Tobacco Use  . Smoking status: Never Smoker  . Smokeless tobacco: Never Used  Substance and Sexual Activity  . Alcohol use: Yes    Comment: rare  . Drug use: No  . Sexual activity: Yes    Birth control/protection: Pill  Lifestyle  . Physical activity    Days per week: Not on file    Minutes per session: Not on file  . Stress: Not on file  Relationships  . Social Musicianconnections    Talks on phone: Not on file    Gets together: Not on file    Attends religious service: Not on file    Active member of club or organization: Not on file    Attends meetings of clubs or organizations: Not on file    Relationship status: Not on file  . Intimate partner violence    Fear of current  or ex partner: Not on file    Emotionally abused: Not on file    Physically abused: Not on file    Forced sexual activity: Not on file  Other Topics Concern  . Not on file  Social History Narrative  . Not on file     Constitutional: Denies fever, malaise, fatigue, headache or abrupt weight changes.  Respiratory: Denies difficulty breathing, shortness of breath, cough or sputum production.   Cardiovascular: Denies chest pain, chest tightness, palpitations or swelling in the hands or feet.  Musculoskeletal: Pt reports pain in left index finger. Denies decrease in range of motion, difficulty with gait, muscle pain or joint swelling.  Skin: Denies redness, rashes, lesions or ulcercations.  Neuro: Denies numbness and tingling in left index finger.  No other specific complaints in a complete review of systems (except as listed in HPI above).  Objective:   Physical Exam   BP 104/66   Pulse 63   Temp 98.3 F (36.8 C) (Oral)   Wt 137 lb  (62.1 kg)   LMP 05/29/2018   SpO2 98%   BMI 22.62 kg/m  Wt Readings from Last 3 Encounters:  07/12/18 137 lb (62.1 kg)  05/11/18 135 lb (61.2 kg)  05/08/18 135 lb (61.2 kg)    General: Appears her stated age, well developed, well nourished in NAD. Skin: Warm, dry and intact. No redness, swelling of the left index finger. Cardiovascular: Normal rate and rhythm. Radial pulses 2+ bilaterally. Pulmonary/Chest: Normal effort and positive vesicular breath sounds. No respiratory distress. No wheezes, rales or ronchi noted.  Musculoskeletal: Normal flexion, extension of the left index finger. Able to make a fist. Hand grips equal. Neurological: Alert and oriented. Sensation intact to BUE.   BMET    Component Value Date/Time   NA 140 10/12/2017 1237   K 4.2 10/12/2017 1237   CL 106 10/12/2017 1237   CO2 28 10/12/2017 1237   GLUCOSE 83 10/12/2017 1237   BUN 7 10/12/2017 1237   CREATININE 0.84 10/12/2017 1237   CALCIUM 9.2 10/12/2017 1237    Lipid Panel     Component Value Date/Time   CHOL 148 10/12/2017 1237   TRIG 91.0 10/12/2017 1237   HDL 44.40 10/12/2017 1237   CHOLHDL 3 10/12/2017 1237   VLDL 18.2 10/12/2017 1237   LDLCALC 86 10/12/2017 1237    CBC    Component Value Date/Time   WBC 7.8 01/18/2018 1003   RBC 3.95 01/18/2018 1003   HGB 12.6 01/18/2018 1003   HCT 38.4 01/18/2018 1003   PLT 274 01/18/2018 1003   MCV 97.2 01/18/2018 1003   MCH 31.9 01/18/2018 1003   MCHC 32.8 01/18/2018 1003   RDW 12.8 01/18/2018 1003    Hgb A1C Lab Results  Component Value Date   HGBA1C 5.4 10/12/2017           Assessment & Plan:   Pain in Left Index Finger, Late Menses:  Urine pregnancy test negative Continue OCP's Will obtain xray of left index finger today Continue Ibuprofen as needed  Will follow up after xray results are back, return precautions discussed Webb Silversmith, NP

## 2018-07-12 NOTE — Patient Instructions (Signed)
Finger Sprain, Adult  A finger sprain is a tear or stretch in a ligament in your finger. Ligaments are tissues that connect bones to each other.  Follow these instructions at home:  If you have a splint:    · Do not put pressure on any part of the splint until it is fully hardened. This may take many hours.  · Wear the splint as told by your doctor. Take it off only as told by your doctor.  · Loosen the splint if your fingers tingle, lose feeling (get numb), or turn cold and blue.  · Keep the splint clean.  · If the splint is not waterproof:  ? Do not let it get wet.  ? Cover it with a watertight covering when you take a bath or a shower.  If you have a cast:  · Do not put pressure on any part of the cast until it is fully hardened. This may take many hours.  · Do not stick anything inside the cast to scratch your skin.  · Check the skin around the cast every day. Tell your doctor about any concerns.  · You may put lotion on dry skin around the edges of the cast. Do not put lotion on the skin under the cast.  · Keep the cast clean.  · If the cast is not waterproof:  ? Do not let it get wet.  ? Cover it with a watertight covering when you take a bath or a shower.  Managing pain, stiffness, and swelling  · If directed, put ice on the injured area:  ? If you have a removable splint, take it off as told by your doctor.  ? Put ice in a plastic bag.  ? Place a towel between your skin and the bag or between your cast and the bag.  ? Leave the ice on for 20 minutes, 2-3 times a day.  · Gently move your fingers often to avoid stiffness and to lessen swelling.  · Raise (elevate) the injured area above the level of your heart while you are sitting or lying down.  Medicines  · Take over-the-counter and prescription medicines only as told by your doctor.  · Do not drive or use heavy machinery while taking prescription pain medicine.  General instructions  · Keep any bandages (dressings) dry until your doctor says they can be  taken off.  · Do exercises as told by your doctor or physical therapist.  · Do not wear rings on your injured finger.  · Keep all follow-up visits as told by your doctor. This is important.  Get help right away if:  · Your pain is not helped by medicine.  · Your bruising or swelling gets worse.  · Your splint or cast is damaged.  · You lose feeling in your finger.  · Your finger turns blue.  · Your finger feels colder than normal when you touch it.  · You have a fever.  Summary  · A finger sprain is a tear or stretch in a ligament in your finger. Ligaments are tissues that connect bones to each other.  · If you have a splint, loosen the splint if your fingers tingle, lose feeling (get numb), or turn cold and blue.  · Gently move your fingers often to avoid stiffness and to lessen swelling.  · If directed, put ice on the injured area.  This information is not intended to replace advice given to you by your health   care provider. Make sure you discuss any questions you have with your health care provider.  Document Released: 02/12/2010 Document Revised: 04/01/2016 Document Reviewed: 04/01/2016  Elsevier Interactive Patient Education © 2019 [employer]

## 2018-07-18 ENCOUNTER — Ambulatory Visit (INDEPENDENT_AMBULATORY_CARE_PROVIDER_SITE_OTHER): Payer: 59 | Admitting: Psychology

## 2018-07-18 DIAGNOSIS — F411 Generalized anxiety disorder: Secondary | ICD-10-CM | POA: Diagnosis not present

## 2018-07-27 ENCOUNTER — Ambulatory Visit (INDEPENDENT_AMBULATORY_CARE_PROVIDER_SITE_OTHER): Payer: 59 | Admitting: Psychology

## 2018-07-27 DIAGNOSIS — F411 Generalized anxiety disorder: Secondary | ICD-10-CM

## 2018-07-31 ENCOUNTER — Ambulatory Visit (INDEPENDENT_AMBULATORY_CARE_PROVIDER_SITE_OTHER): Payer: 59 | Admitting: Psychology

## 2018-07-31 DIAGNOSIS — F411 Generalized anxiety disorder: Secondary | ICD-10-CM

## 2018-08-09 ENCOUNTER — Ambulatory Visit (INDEPENDENT_AMBULATORY_CARE_PROVIDER_SITE_OTHER): Payer: 59 | Admitting: Psychology

## 2018-08-09 DIAGNOSIS — F411 Generalized anxiety disorder: Secondary | ICD-10-CM

## 2018-08-11 ENCOUNTER — Encounter: Payer: Self-pay | Admitting: Internal Medicine

## 2018-08-14 ENCOUNTER — Other Ambulatory Visit: Payer: Self-pay

## 2018-08-14 ENCOUNTER — Ambulatory Visit (INDEPENDENT_AMBULATORY_CARE_PROVIDER_SITE_OTHER): Payer: Managed Care, Other (non HMO) | Admitting: Internal Medicine

## 2018-08-14 ENCOUNTER — Encounter: Payer: Self-pay | Admitting: Internal Medicine

## 2018-08-14 ENCOUNTER — Ambulatory Visit (INDEPENDENT_AMBULATORY_CARE_PROVIDER_SITE_OTHER): Payer: 59 | Admitting: Psychology

## 2018-08-14 VITALS — BP 106/70 | HR 66 | Temp 97.8°F | Wt 136.0 lb

## 2018-08-14 DIAGNOSIS — N3001 Acute cystitis with hematuria: Secondary | ICD-10-CM | POA: Diagnosis not present

## 2018-08-14 DIAGNOSIS — M549 Dorsalgia, unspecified: Secondary | ICD-10-CM | POA: Diagnosis not present

## 2018-08-14 DIAGNOSIS — F411 Generalized anxiety disorder: Secondary | ICD-10-CM | POA: Diagnosis not present

## 2018-08-14 LAB — POC URINALSYSI DIPSTICK (AUTOMATED)
Bilirubin, UA: NEGATIVE
Blood, UA: NEGATIVE
Glucose, UA: NEGATIVE
Ketones, UA: NEGATIVE
Nitrite, UA: NEGATIVE
Protein, UA: POSITIVE — AB
Spec Grav, UA: 1.02 (ref 1.010–1.025)
Urobilinogen, UA: 0.2 E.U./dL
pH, UA: 6.5 (ref 5.0–8.0)

## 2018-08-14 MED ORDER — NITROFURANTOIN MONOHYD MACRO 100 MG PO CAPS: 100.0000 mg | ORAL_CAPSULE | Freq: Two times a day (BID) | ORAL | 0 refills | Status: DC

## 2018-08-14 NOTE — Addendum Note (Signed)
Addended by: Lurlean Nanny on: 08/14/2018 04:34 PM   Modules accepted: Orders

## 2018-08-14 NOTE — Patient Instructions (Signed)
Urinary Tract Infection, Adult A urinary tract infection (UTI) is an infection of any part of the urinary tract. The urinary tract includes:  The kidneys.  The ureters.  The bladder.  The urethra. These organs make, store, and get rid of pee (urine) in the body. What are the causes? This is caused by germs (bacteria) in your genital area. These germs grow and cause swelling (inflammation) of your urinary tract. What increases the risk? You are more likely to develop this condition if:  You have a small, thin tube (catheter) to drain pee.  You cannot control when you pee or poop (incontinence).  You are female, and: ? You use these methods to prevent pregnancy: ? A medicine that kills sperm (spermicide). ? A device that blocks sperm (diaphragm). ? You have low levels of a female hormone (estrogen). ? You are pregnant.  You have genes that add to your risk.  You are sexually active.  You take antibiotic medicines.  You have trouble peeing because of: ? A prostate that is bigger than normal, if you are female. ? A blockage in the part of your body that drains pee from the bladder (urethra). ? A kidney stone. ? A nerve condition that affects your bladder (neurogenic bladder). ? Not getting enough to drink. ? Not peeing often enough.  You have other conditions, such as: ? Diabetes. ? A weak disease-fighting system (immune system). ? Sickle cell disease. ? Gout. ? Injury of the spine. What are the signs or symptoms? Symptoms of this condition include:  Needing to pee right away (urgently).  Peeing often.  Peeing small amounts often.  Pain or burning when peeing.  Blood in the pee.  Pee that smells bad or not like normal.  Trouble peeing.  Pee that is cloudy.  Fluid coming from the vagina, if you are female.  Pain in the belly or lower back. Other symptoms include:  Throwing up (vomiting).  No urge to eat.  Feeling mixed up (confused).  Being tired  and grouchy (irritable).  A fever.  Watery poop (diarrhea). How is this treated? This condition may be treated with:  Antibiotic medicine.  Other medicines.  Drinking enough water. Follow these instructions at home:  Medicines  Take over-the-counter and prescription medicines only as told by your doctor.  If you were prescribed an antibiotic medicine, take it as told by your doctor. Do not stop taking it even if you start to feel better. General instructions  Make sure you: ? Pee until your bladder is empty. ? Do not hold pee for a long time. ? Empty your bladder after sex. ? Wipe from front to back after pooping if you are a female. Use each tissue one time when you wipe.  Drink enough fluid to keep your pee pale yellow.  Keep all follow-up visits as told by your doctor. This is important. Contact a doctor if:  You do not get better after 1-2 days.  Your symptoms go away and then come back. Get help right away if:  You have very bad back pain.  You have very bad pain in your lower belly.  You have a fever.  You are sick to your stomach (nauseous).  You are throwing up. Summary  A urinary tract infection (UTI) is an infection of any part of the urinary tract.  This condition is caused by germs in your genital area.  There are many risk factors for a UTI. These include having a small, thin   tube to drain pee and not being able to control when you pee or poop.  Treatment includes antibiotic medicines for germs.  Drink enough fluid to keep your pee pale yellow. This information is not intended to replace advice given to you by your health care provider. Make sure you discuss any questions you have with your health care provider. Document Released: 06/29/2007 Document Revised: 12/28/2017 Document Reviewed: 07/20/2017 Elsevier Patient Education  2020 [employer]  

## 2018-08-14 NOTE — Progress Notes (Signed)
HPI  Pt presents to the clinic today with c/o a stabbing right mid back pain which started last Thursday.  She started having burning with urination and frequency on Sunday and noticed a pink-tinge color to her urine.  She saw her OBGYN yesterday and they performed a urinalysis which was negative, sent urine for culture, a wet prep which revealed BV, and STI testing.  She is coming here because she is still having the stabbing pain intermittently.  She denies fever or history of kidney stones.   Review of Systems  Past Medical History:  Diagnosis Date  . Childhood asthma   . Frequent headaches   . Headache   . Medical history non-contributory   . 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 Hypertension Mother   . Diabetes Maternal Grandmother   . Hyperlipidemia Maternal Grandmother   . Other Cousin        duplicate 15 Q syndrome  . COPD Father   . Asthma Maternal Grandfather   . COPD Maternal Grandfather   . Hyperlipidemia Maternal Grandfather   . Dementia Paternal Grandmother   . Alzheimer's disease Paternal Grandfather     Social History   Socioeconomic History  . Marital status: Single    Spouse name: Not on file  . Number of children: Not on file  . Years of education: Not on file  . Highest education level: Not on file  Occupational History  . Not on file  Social Needs  . Financial resource strain: Not on file  . Food insecurity    Worry: Not on file    Inability: Not on file  . Transportation needs    Medical: Not on file    Non-medical: Not on file  Tobacco Use  . Smoking status: Never Smoker  . Smokeless tobacco: Never Used  Substance and Sexual Activity  . Alcohol use: Yes    Comment: rare  . Drug use: No  . Sexual activity: Yes    Birth control/protection: Pill  Lifestyle  . Physical activity    Days per week: Not on file    Minutes per session: Not on  file  . Stress: Not on file  Relationships  . Social Musicianconnections    Talks on phone: Not on file    Gets together: Not on file    Attends religious service: Not on file    Active member of club or organization: Not on file    Attends meetings of clubs or organizations: Not on file    Relationship status: Not on file  . Intimate partner violence    Fear of current or ex partner: Not on file    Emotionally abused: Not on file    Physically abused: Not on file    Forced sexual activity: Not on file  Other Topics Concern  . Not on file  Social History Narrative  . Not on file    Allergies  Allergen Reactions  . Lexapro [Escitalopram Oxalate] Diarrhea and Nausea Only    Headache     Constitutional: Denies fever, malaise, fatigue, headache or abrupt weight changes.   GU: Pt reports frequency, burning, and pain with urination.  Skin: Denies redness, rashes, lesions or ulcercations.   No other specific complaints in a complete review of systems (except as listed in HPI above).    Objective:   Physical Exam  BP 106/70   Pulse 66   Temp 97.8 F (36.6 C) (Temporal)   Wt 136 lb (61.7 kg)   LMP 07/22/2018   SpO2 98%   BMI 22.46 kg/m    Wt Readings from Last 3 Encounters:  07/12/18 137 lb (62.1 kg)  05/11/18 135 lb (61.2 kg)  05/08/18 135 lb (61.2 kg)    General: Appears her stated age, well developed, well nourished in NAD. Cardiovascular: Normal rate and rhythm. S1,S2 noted.   Pulmonary/Chest: Normal effort and positive vesicular breath sounds. No respiratory distress. No wheezes, rales or ronchi noted.  Abdomen: Soft. Normal bowel sounds. No distention or masses noted.  Tender to palpation over the bladder area. No CVA tenderness.        Assessment & Plan:    Frequency, Dysuria, Right Flank Pain:  Urinalysis: 1+ blood, 1+ leuks Will send urine culture eRx sent if for Macrobid 100 mg BID x 5 days OK to take AZO OTC Drink plenty of fluids  RTC as needed or if  symptoms persist. Webb Silversmith, NP

## 2018-08-17 LAB — URINE CULTURE
MICRO NUMBER:: 688827
SPECIMEN QUALITY:: ADEQUATE

## 2018-08-22 ENCOUNTER — Ambulatory Visit (INDEPENDENT_AMBULATORY_CARE_PROVIDER_SITE_OTHER): Payer: 59 | Admitting: Psychology

## 2018-08-22 DIAGNOSIS — F411 Generalized anxiety disorder: Secondary | ICD-10-CM

## 2018-08-23 ENCOUNTER — Encounter: Payer: Self-pay | Admitting: Internal Medicine

## 2018-08-23 DIAGNOSIS — M79645 Pain in left finger(s): Secondary | ICD-10-CM

## 2018-08-24 MED ORDER — PREDNISONE 10 MG PO TABS: ORAL_TABLET | ORAL | 0 refills | Status: DC

## 2018-08-24 NOTE — Addendum Note (Signed)
Addended by: Jearld Fenton on: 08/24/2018 02:57 PM   Modules accepted: Orders

## 2018-08-24 NOTE — Addendum Note (Signed)
Addended by: Jearld Fenton on: 08/24/2018 01:39 PM   Modules accepted: Orders

## 2018-08-30 ENCOUNTER — Encounter: Payer: Self-pay | Admitting: Internal Medicine

## 2018-08-31 ENCOUNTER — Other Ambulatory Visit: Payer: Self-pay

## 2018-08-31 ENCOUNTER — Ambulatory Visit (INDEPENDENT_AMBULATORY_CARE_PROVIDER_SITE_OTHER): Payer: Managed Care, Other (non HMO) | Admitting: Internal Medicine

## 2018-08-31 ENCOUNTER — Encounter: Payer: Self-pay | Admitting: Internal Medicine

## 2018-08-31 ENCOUNTER — Other Ambulatory Visit: Payer: Managed Care, Other (non HMO)

## 2018-08-31 DIAGNOSIS — Z202 Contact with and (suspected) exposure to infections with a predominantly sexual mode of transmission: Secondary | ICD-10-CM

## 2018-08-31 MED ORDER — AZITHROMYCIN 1 G PO PACK: 1.0000 g | PACK | Freq: Once | ORAL | 0 refills | Status: AC

## 2018-08-31 NOTE — Patient Instructions (Signed)
Chlamydia, Female  Chlamydia is a STD (sexually transmitted disease). This is an infection that spreads through sexual contact. If it is not treated, it can cause serious problems. It must be treated with antibiotic medicine. If this infection is not treated and you are pregnant or become pregnant, your baby could get it during delivery. This may cause bad health problems for the baby. Sometimes, you may not have symptoms (asymptomatic). When you have symptoms, they can include:  Burning when you pee (urinate).  Peeing often.  Fluid (discharge) coming from the vagina.  Redness, soreness, and swelling (inflammation) of the butt (rectum).  Bleeding or fluid coming from the butt.  Belly (abdominal) pain.  Pain during sex.  Bleeding between periods.  Itching, burning, or redness in the eyes.  Fluid coming from the eyes. Follow these instructions at home: Medicines  Take over-the-counter and prescription medicines only as told by your doctor.  Take your antibiotic medicine as told by your doctor. Do not stop taking the antibiotic even if you start to feel better. Sexual activity  Tell sex partners about your infection. Sex partners are people you had oral, anal, or vaginal sex with within 60 days of when you started getting sick. They need treatment, too.  Do not have sex until: ? You and your sex partners have been treated. ? Your doctor says it is okay.  If you have a single dose treatment, wait 7 days before having sex. General instructions  It is up to you to get your test results. Ask your doctor when your results will be ready.  Get a lot of rest.  Eat healthy foods.  Drink enough fluid to keep your pee (urine) clear or pale yellow.  Keep all follow-up visits as told by your doctor. You may need tests after 3 months. Preventing chlamydia  The only way to prevent chlamydia is not to have sex. To lower your risk: ? Use latex condoms correctly. Do this every time  you have sex. ? Avoid having many sex partners. ? Ask if your partner has been tested for STDs and if he or she had negative results. Contact a doctor if:  You get new symptoms.  You do not get better with treatment.  You have a fever or chills.  You have pain during sex. Get help right away if:  Your pain gets worse and does not get better with medicine.  You get flu-like symptoms, such as: ? Night sweats. ? Sore throat. ? Muscle aches.  You feel sick to your stomach (nauseous).  You throw up (vomit).  You have trouble swallowing.  You have bleeding: ? Between periods. ? After sex.  You have irregular periods.  You have belly pain that does not get better with medicine.  You have lower back pain that does not get better with medicine.  You feel weak or dizzy.  You pass out (faint).  You are pregnant and you get symptoms of chlamydia. Summary  Chlamydia is an infection that spreads through sexual contact.  Sometimes, chlamydia can cause no symptoms (asymptomatic).  Do not have sex until your doctor says it is okay.  All sex partners will have to be treated for chlamydia. This information is not intended to replace advice given to you by your health care provider. Make sure you discuss any questions you have with your health care provider. Document Released: 10/20/2007 Document Revised: 07/04/2017 Document Reviewed: 12/31/2015 Elsevier Patient Education  2020 [employer]  

## 2018-08-31 NOTE — Addendum Note (Signed)
Addended by: Cloyd Stagers on: 08/31/2018 04:41 PM   Modules accepted: Orders

## 2018-08-31 NOTE — Progress Notes (Signed)
Virtual Visit via Video Note  I connected with [name] on 08/31/18 at  4:15 PM EDT by a video enabled telemedicine application and verified that I am speaking with the correct person using two identifiers.  Location: Patient: Work Provider: Office   I discussed the limitations of evaluation and management by telemedicine and the availability of in person appointments. The patient expressed understanding and agreed to proceed.  History of Present Illness:   Pt reports exposure to chlamydia. She reports her boyfriend tested positive and they do not use protection. She is not having any symptoms at this time.   Past Medical History:  Diagnosis Date  . Childhood asthma   . Frequent headaches   . Headache   . Medical history non-contributory   . 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 BIOTIN PO Take 2 each by mouth daily.    . Ferrous Sulfate (IRON) 325 (65 Fe) MG TABS Take by mouth.    Lenda Kelp 1/20 1-20 MG-MCG tablet Take 1 tablet by mouth daily.  11  . metroNIDAZOLE (METROGEL VAGINAL) 0.75 % vaginal gel Metrogel Vaginal 1.69 %  Insert 1 applicatorful every day by vaginal route for 5 days.    . nitrofurantoin, macrocrystal-monohydrate, (MACROBID) 100 MG capsule Take 1 capsule (100 mg total) by mouth 2 (two) times daily. 10 capsule 0  . predniSONE (DELTASONE) 10 MG tablet Take 6 tabs day 1, 5 tabs day 2, 4 tabs day 3, 3 tabs day 4, 2 tabs day 5, 1 tab day 6 21 tablet 0  . sertraline (ZOLOFT) 50 MG tablet Take 1 tablet (50 mg total) by mouth daily. 90 tablet 0   No current facility-administered medications for this visit.     Allergies  Allergen Reactions  . Lexapro [Escitalopram Oxalate] Diarrhea and Nausea Only    Headache    Family History  Problem Relation Age of Onset  . Hypertension Mother   . Diabetes Maternal Grandmother   . Hyperlipidemia  Maternal Grandmother   . Other Cousin        duplicate 15 Q syndrome  . COPD Father   . Asthma Maternal Grandfather   . COPD Maternal Grandfather   . Hyperlipidemia Maternal Grandfather   . Dementia Paternal Grandmother   . Alzheimer's disease Paternal Grandfather     Social History   Socioeconomic History  . Marital status: Single    Spouse name: Not on file  . Number of children: Not on file  . Years of education: Not on file  . Highest education level: Not on file  Occupational History  . Not on file  Social Needs  . Financial resource strain: Not on file  . Food insecurity    Worry: Not on file    Inability: Not on file  . Transportation needs    Medical: Not on file    Non-medical: Not on file  Tobacco Use  . Smoking status: Never Smoker  . Smokeless tobacco: Never Used  Substance and Sexual Activity  . Alcohol use: Yes    Comment: rare  . Drug use: No  . Sexual activity: Yes    Birth control/protection: Pill  Lifestyle  . Physical activity    Days per week: Not on file    Minutes per session: Not on file  . Stress: Not on file  Relationships  . Social connections  Talks on phone: Not on file    Gets together: Not on file    Attends religious service: Not on file    Active member of club or organization: Not on file    Attends meetings of clubs or organizations: Not on file    Relationship status: Not on file  . Intimate partner violence    Fear of current or ex partner: Not on file    Emotionally abused: Not on file    Physically abused: Not on file    Forced sexual activity: Not on file  Other Topics Concern  . Not on file  Social History Narrative  . Not on file     Constitutional: Denies fever, malaise, fatigue, headache or abrupt weight changes.  Respiratory: Denies difficulty breathing, shortness of breath, cough or sputum production.   Cardiovascular: Denies chest pain, chest tightness, palpitations or swelling in the hands or feet.   Gastrointestinal: Denies abdominal pain, bloating, constipation, diarrhea or blood in the stool.  GU: Denies urgency, frequency, pain with urination, burning sensation, blood in urine, odor or discharge. Skin: Denies redness, rashes, lesions or ulcercations.    No other specific complaints in a complete review of systems (except as listed in HPI above).  Observations/Objective:  Wt Readings from Last 3 Encounters:  08/14/18 136 lb (61.7 kg)  07/12/18 137 lb (62.1 kg)  05/11/18 135 lb (61.2 kg)    General: Appears her stated age, well developed, well nourished in NAD. Pulmonary/Chest: Normal effort. No respiratory distress.   Abdomen: . No distention or masses noted.  Neurological: Alert and oriented.  Psychiatric: Mood and affect normal. Behavior is normal. Judgment and thought content normal.    BMET    Component Value Date/Time   NA 140 10/12/2017 1237   K 4.2 10/12/2017 1237   CL 106 10/12/2017 1237   CO2 28 10/12/2017 1237   GLUCOSE 83 10/12/2017 1237   BUN 7 10/12/2017 1237   CREATININE 0.84 10/12/2017 1237   CALCIUM 9.2 10/12/2017 1237    Lipid Panel     Component Value Date/Time   CHOL 148 10/12/2017 1237   TRIG 91.0 10/12/2017 1237   HDL 44.40 10/12/2017 1237   CHOLHDL 3 10/12/2017 1237   VLDL 18.2 10/12/2017 1237   LDLCALC 86 10/12/2017 1237    CBC    Component Value Date/Time   WBC 7.8 01/18/2018 1003   RBC 3.95 01/18/2018 1003   HGB 12.6 01/18/2018 1003   HCT 38.4 01/18/2018 1003   PLT 274 01/18/2018 1003   MCV 97.2 01/18/2018 1003   MCH 31.9 01/18/2018 1003   MCHC 32.8 01/18/2018 1003   RDW 12.8 01/18/2018 1003    Hgb A1C Lab Results  Component Value Date   HGBA1C 5.4 10/12/2017        Assessment and Plan:  Exposure to Chlamydia:  Will obtain urine gonorrhea and chlamydia Will prophylactically treat with Azithromycin 1 gm PO x 1 Discussed safe sexual practices  Will follow up after lab results, return precautions  discussed  Follow Up Instructions:    I discussed the assessment and treatment plan with the patient. The patient was provided an opportunity to ask questions and all were answered. The patient agreed with the plan and demonstrated an understanding of the instructions.   The patient was advised to call back or seek an in-person evaluation if the symptoms worsen or if the condition fails to improve as anticipated.    Alicia Reaperegina Corena Tilson, NP

## 2018-09-01 ENCOUNTER — Ambulatory Visit (INDEPENDENT_AMBULATORY_CARE_PROVIDER_SITE_OTHER): Payer: 59 | Admitting: Psychology

## 2018-09-01 DIAGNOSIS — F411 Generalized anxiety disorder: Secondary | ICD-10-CM

## 2018-09-04 ENCOUNTER — Ambulatory Visit (INDEPENDENT_AMBULATORY_CARE_PROVIDER_SITE_OTHER): Payer: 59 | Admitting: Psychology

## 2018-09-04 DIAGNOSIS — F411 Generalized anxiety disorder: Secondary | ICD-10-CM | POA: Diagnosis not present

## 2018-09-04 LAB — C. TRACHOMATIS/N. GONORRHOEAE RNA
C. trachomatis RNA, TMA: NOT DETECTED
N. gonorrhoeae RNA, TMA: NOT DETECTED

## 2018-09-15 ENCOUNTER — Other Ambulatory Visit: Payer: Self-pay | Admitting: Internal Medicine

## 2018-09-29 ENCOUNTER — Encounter (HOSPITAL_COMMUNITY): Payer: Self-pay | Admitting: Emergency Medicine

## 2018-09-29 ENCOUNTER — Other Ambulatory Visit: Payer: Self-pay

## 2018-09-29 ENCOUNTER — Emergency Department (HOSPITAL_COMMUNITY): Payer: Medicaid Other

## 2018-09-29 ENCOUNTER — Emergency Department (HOSPITAL_COMMUNITY)
Admission: EM | Admit: 2018-09-29 | Discharge: 2018-09-29 | Disposition: A | Discharge status: Home / Self Care | Payer: Medicaid Other | Attending: Emergency Medicine | Admitting: Emergency Medicine

## 2018-09-29 DIAGNOSIS — S90512A Abrasion, left ankle, initial encounter: Secondary | ICD-10-CM | POA: Diagnosis not present

## 2018-09-29 DIAGNOSIS — M25512 Pain in left shoulder: Secondary | ICD-10-CM | POA: Diagnosis not present

## 2018-09-29 DIAGNOSIS — M25572 Pain in left ankle and joints of left foot: Secondary | ICD-10-CM | POA: Diagnosis not present

## 2018-09-29 DIAGNOSIS — Y9389 Activity, other specified: Secondary | ICD-10-CM | POA: Diagnosis not present

## 2018-09-29 DIAGNOSIS — Y92410 Unspecified street and highway as the place of occurrence of the external cause: Secondary | ICD-10-CM | POA: Diagnosis not present

## 2018-09-29 DIAGNOSIS — T07XXXA Unspecified multiple injuries, initial encounter: Secondary | ICD-10-CM

## 2018-09-29 DIAGNOSIS — S70312A Abrasion, left thigh, initial encounter: Secondary | ICD-10-CM | POA: Diagnosis not present

## 2018-09-29 DIAGNOSIS — S9032XA Contusion of left foot, initial encounter: Secondary | ICD-10-CM

## 2018-09-29 DIAGNOSIS — S99912A Unspecified injury of left ankle, initial encounter: Secondary | ICD-10-CM | POA: Diagnosis not present

## 2018-09-29 DIAGNOSIS — Y999 Unspecified external cause status: Secondary | ICD-10-CM | POA: Insufficient documentation

## 2018-09-29 DIAGNOSIS — S40011A Contusion of right shoulder, initial encounter: Secondary | ICD-10-CM | POA: Diagnosis not present

## 2018-09-29 DIAGNOSIS — S99922A Unspecified injury of left foot, initial encounter: Secondary | ICD-10-CM | POA: Diagnosis not present

## 2018-09-29 DIAGNOSIS — M25511 Pain in right shoulder: Secondary | ICD-10-CM | POA: Diagnosis not present

## 2018-09-29 DIAGNOSIS — R52 Pain, unspecified: Secondary | ICD-10-CM | POA: Diagnosis not present

## 2018-09-29 DIAGNOSIS — S4991XA Unspecified injury of right shoulder and upper arm, initial encounter: Secondary | ICD-10-CM | POA: Diagnosis not present
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
## 2018-09-29 NOTE — ED Notes (Signed)
Patient verbalizes understanding of discharge instructions. Opportunity for questioning and answers were provided. Armband removed by staff, pt discharged from ED.  

## 2018-09-29 NOTE — ED Notes (Signed)
Pt to xray at this time.
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
## 2018-11-02 ENCOUNTER — Other Ambulatory Visit: Payer: Self-pay

## 2018-11-02 DIAGNOSIS — Z20828 Contact with and (suspected) exposure to other viral communicable diseases: Secondary | ICD-10-CM | POA: Diagnosis not present

## 2018-11-02 DIAGNOSIS — Z20822 Contact with and (suspected) exposure to covid-19: Secondary | ICD-10-CM

## 2018-11-03 LAB — NOVEL CORONAVIRUS, NAA: SARS-CoV-2, NAA: NOT DETECTED

## 2018-12-04 ENCOUNTER — Encounter: Payer: Self-pay | Admitting: Internal Medicine

## 2018-12-10 ENCOUNTER — Ambulatory Visit (INDEPENDENT_AMBULATORY_CARE_PROVIDER_SITE_OTHER): Payer: 59 | Admitting: Internal Medicine

## 2018-12-10 ENCOUNTER — Ambulatory Visit (INDEPENDENT_AMBULATORY_CARE_PROVIDER_SITE_OTHER)
Admission: RE | Admit: 2018-12-10 | Discharge: 2018-12-10 | Disposition: A | Discharge status: Home / Self Care | Payer: 59 | Source: Ambulatory Visit | Attending: Internal Medicine | Admitting: Internal Medicine

## 2018-12-10 ENCOUNTER — Encounter: Payer: Self-pay | Admitting: Internal Medicine

## 2018-12-10 ENCOUNTER — Other Ambulatory Visit: Payer: Self-pay

## 2018-12-10 VITALS — BP 116/68 | HR 87 | Temp 98.3°F | Wt 150.0 lb

## 2018-12-10 DIAGNOSIS — R2242 Localized swelling, mass and lump, left lower limb: Secondary | ICD-10-CM | POA: Diagnosis not present

## 2018-12-10 DIAGNOSIS — M25562 Pain in left knee: Secondary | ICD-10-CM
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
## 2018-12-10 NOTE — Patient Instructions (Signed)

## 2018-12-11 ENCOUNTER — Ambulatory Visit: Payer: Managed Care, Other (non HMO) | Admitting: Internal Medicine

## 2018-12-17 ENCOUNTER — Other Ambulatory Visit: Payer: Self-pay

## 2018-12-17 ENCOUNTER — Encounter: Payer: Self-pay | Admitting: Internal Medicine

## 2018-12-17 ENCOUNTER — Ambulatory Visit (INDEPENDENT_AMBULATORY_CARE_PROVIDER_SITE_OTHER): Payer: 59 | Admitting: Internal Medicine

## 2018-12-17 VITALS — BP 120/72 | HR 87 | Temp 98.9°F | Wt 150.0 lb

## 2018-12-17 DIAGNOSIS — R300 Dysuria: Secondary | ICD-10-CM | POA: Diagnosis not present

## 2018-12-17 DIAGNOSIS — R3915 Urgency of urination: Secondary | ICD-10-CM

## 2018-12-17 DIAGNOSIS — R109 Unspecified abdominal pain: Secondary | ICD-10-CM

## 2018-12-17 LAB — POC URINALSYSI DIPSTICK (AUTOMATED)
Blood, UA: NEGATIVE
Glucose, UA: NEGATIVE
Ketones, UA: NEGATIVE
Nitrite, UA: POSITIVE
Protein, UA: NEGATIVE
Spec Grav, UA: 1.015 (ref 1.010–1.025)
Urobilinogen, UA: 1 E.U./dL
pH, UA: 6.5 (ref 5.0–8.0)

## 2018-12-17 NOTE — Progress Notes (Signed)
HPI  Pt presents to the clinic today with c/o urinary frequency, urgency and dysuria. This started 1 day ago. She reports associated right flank pain. She denies fever, chills, nausea, abdominal pain or blood in her urine. She denies vaginal discharge, odor or irritation. She has tried AZO OTC with some improvement. She has no history of kidney stones. She is sexually active but declines STD screening.   Review of Systems  Past Medical History:  Diagnosis Date  . Childhood asthma   . Frequent headaches   . Headache   . Medical history non-contributory   . 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 Hypertension Mother   . Diabetes Maternal Grandmother   . Hyperlipidemia Maternal Grandmother   . Other Cousin        duplicate 15 Q syndrome  . COPD Father   . Asthma Maternal Grandfather   . COPD Maternal Grandfather   . Hyperlipidemia Maternal Grandfather   . Dementia Paternal Grandmother   . Alzheimer's disease Paternal Grandfather     Social History   Socioeconomic History  . Marital status: Single    Spouse name: Not on file  . Number of children: Not on file  . Years of education: Not on file  . Highest education level: Not on file  Occupational History  . Not on file  Social Needs  . Financial resource strain: Not on file  . Food insecurity    Worry: Not on file    Inability: Not on file  . Transportation needs    Medical: Not on file    Non-medical: Not on file  Tobacco Use  . Smoking status: Never Smoker  . Smokeless tobacco: Never Used  Substance and Sexual Activity  . Alcohol use: Yes    Comment: rare  . Drug use: No  . Sexual activity: Yes    Birth control/protection: Pill  Lifestyle  . Physical activity    Days per week: Not on file    Minutes per session: Not on file  . Stress: Not on file  Relationships  . Social Musician on phone: Not on  file    Gets together: Not on file    Attends religious service: Not on file    Active member of club or organization: Not on file    Attends meetings of clubs or organizations: Not on file    Relationship status: Not on file  . Intimate partner violence    Fear of current or ex partner: Not on file    Emotionally abused: Not on file    Physically abused: Not on file    Forced sexual activity: Not on file  Other Topics Concern  . Not on file  Social History Narrative  . Not on file    Allergies  Allergen Reactions  . Lexapro [Escitalopram Oxalate] Diarrhea and Nausea Only    Headache     Constitutional: Denies fever, malaise, fatigue, headache or abrupt weight changes.   GU: Pt reports urgency, frequency and pain with urination. Denies burning sensation, blood in urine, odor or discharge. Skin: Denies redness, rashes, lesions or ulcercations.   No other specific complaints in a complete review of systems (except as listed in HPI above).    Objective:   Physical Exam  BP 120/72   Pulse 87   Temp 98.9 F (37.2 C) (  Temporal)   Wt 150 lb (68 kg)   SpO2 97%   BMI 24.96 kg/m   Wt Readings from Last 3 Encounters:  12/10/18 150 lb (68 kg)  09/29/18 137 lb (62.1 kg)  08/14/18 136 lb (61.7 kg)    General: Appears her stated age, well developed, well nourished in NAD. Cardiovascular: Normal rate and rhythm. S1,S2 noted.   Pulmonary/Chest: Normal effort and positive vesicular breath sounds. No respiratory distress. No wheezes, rales or ronchi noted.  Abdomen: Soft. Normal bowel sounds. No distention or masses noted.  Tender to palpation over the bladder area. No CVA tenderness.        Assessment & Plan:   Urgency, Frequency, Dysuria:  Urinalysis: 1+ leuks Will send urine culture OK to take AZO OTC Drink plenty of fluids  RTC as needed or if symptoms persist. Webb Silversmith, NP

## 2018-12-17 NOTE — Patient Instructions (Signed)
Urinary Frequency, Adult Urinary frequency means urinating more often than usual. You may urinate every 1-2 hours even though you drink a normal amount of fluid and do not have a bladder infection or condition. Although you urinate more often than normal, the total amount of urine produced in a day is normal. With urinary frequency, you may have an urgent need to urinate often. The stress and anxiety of needing to find a bathroom quickly can make this urge worse. This condition may go away on its own or you may need treatment at home. Home treatment may include bladder training, exercises, taking medicines, or making changes to your diet. Follow these instructions at home: Bladder health   Keep a bladder diary if told by your health care provider. Keep track of: ? What you eat and drink. ? How often you urinate. ? How much you urinate.  Follow a bladder training program if told by your health care provider. This may include: ? Learning to delay going to the bathroom. ? Double urinating (voiding). This helps if you are not completely emptying your bladder. ? Scheduled voiding.  Do Kegel exercises as told by your health care provider. Kegel exercises strengthen the muscles that help control urination, which may help the condition. Eating and drinking  If told by your health care provider, make diet changes, such as: ? Avoiding caffeine. ? Drinking fewer fluids, especially alcohol. ? Not drinking in the evening. ? Avoiding foods or drinks that may irritate the bladder. These include coffee, tea, soda, artificial sweeteners, citrus, tomato-based foods, and chocolate. ? Eating foods that help prevent or ease constipation. Constipation can make this condition worse. Your health care provider may recommend that you:  Drink enough fluid to keep your urine pale yellow.  Take over-the-counter or prescription medicines.  Eat foods that are high in fiber, such as beans, whole grains, and fresh  fruits and vegetables.  Limit foods that are high in fat and processed sugars, such as fried or sweet foods. General instructions  Take over-the-counter and prescription medicines only as told by your health care provider.  Keep all follow-up visits as told by your health care provider. This is important. Contact a health care provider if:  You start urinating more often.  You feel pain or irritation when you urinate.  You notice blood in your urine.  Your urine looks cloudy.  You develop a fever.  You begin vomiting. Get help right away if:  You are unable to urinate. Summary  Urinary frequency means urinating more often than usual. With urinary frequency, you may urinate every 1-2 hours even though you drink a normal amount of fluid and do not have a bladder infection or other bladder condition.  Your health care provider may recommend that you keep a bladder diary, follow a bladder training program, or make dietary changes.  If told by your health care provider, do Kegel exercises to strengthen the muscles that help control urination.  Take over-the-counter and prescription medicines only as told by your health care provider.  Contact a health care provider if your symptoms do not improve or get worse. This information is not intended to replace advice given to you by your health care provider. Make sure you discuss any questions you have with your health care provider. Document Released: 11/06/2008 Document Revised: 07/20/2017 Document Reviewed: 07/20/2017 Elsevier Patient Education  2020 [employer]  

## 2018-12-19 LAB — URINE CULTURE
MICRO NUMBER:: 1129785
SPECIMEN QUALITY:: ADEQUATE

## 2018-12-19 MED ORDER — NITROFURANTOIN MONOHYD MACRO 100 MG PO CAPS: 100.0000 mg | ORAL_CAPSULE | Freq: Two times a day (BID) | ORAL | 0 refills | Status: DC

## 2018-12-19 NOTE — Addendum Note (Signed)
Addended by: Jearld Fenton on: 12/19/2018 01:55 PM   Modules accepted: Orders

## 2018-12-28 ENCOUNTER — Other Ambulatory Visit: Payer: Self-pay

## 2018-12-28 DIAGNOSIS — Z20822 Contact with and (suspected) exposure to covid-19: Secondary | ICD-10-CM

## 2018-12-30 LAB — NOVEL CORONAVIRUS, NAA: SARS-CoV-2, NAA: NOT DETECTED

## 2018-12-31 ENCOUNTER — Encounter: Payer: Self-pay | Admitting: Internal Medicine

## 2018-12-31 MED ORDER — SERTRALINE HCL 50 MG PO TABS: 50.0000 mg | ORAL_TABLET | Freq: Every day | ORAL | 0 refills | Status: DC

## 2019-01-08 ENCOUNTER — Encounter: Payer: Self-pay | Admitting: Internal Medicine

## 2019-01-15 ENCOUNTER — Other Ambulatory Visit: Payer: Self-pay

## 2019-01-15 ENCOUNTER — Encounter: Payer: Self-pay | Admitting: Internal Medicine

## 2019-01-15 ENCOUNTER — Ambulatory Visit (INDEPENDENT_AMBULATORY_CARE_PROVIDER_SITE_OTHER): Payer: 59 | Admitting: Internal Medicine

## 2019-01-15 VITALS — BP 120/80 | HR 67 | Temp 98.4°F | Ht 65.66 in | Wt 147.0 lb

## 2019-01-15 DIAGNOSIS — F410 Panic disorder [episodic paroxysmal anxiety] without agoraphobia: Secondary | ICD-10-CM

## 2019-01-15 DIAGNOSIS — F411 Generalized anxiety disorder: Secondary | ICD-10-CM | POA: Diagnosis not present

## 2019-01-15 DIAGNOSIS — Z0000 Encounter for general adult medical examination without abnormal findings: Secondary | ICD-10-CM | POA: Diagnosis not present

## 2019-01-15 NOTE — Patient Instructions (Signed)
Health Maintenance, Female Adopting a healthy lifestyle and getting preventive care are important in promoting health and wellness. Ask your health care provider about:  The right schedule for you to have regular tests and exams.  Things you can do on your own to prevent diseases and keep yourself healthy. What should I know about diet, weight, and exercise? Eat a healthy diet   Eat a diet that includes plenty of vegetables, fruits, low-fat dairy products, and lean protein.  Do not eat a lot of foods that are high in solid fats, added sugars, or sodium. Maintain a healthy weight Body mass index (BMI) is used to identify weight problems. It estimates body fat based on height and weight. Your health care provider can help determine your BMI and help you achieve or maintain a healthy weight. Get regular exercise Get regular exercise. This is one of the most important things you can do for your health. Most adults should:  Exercise for at least 150 minutes each week. The exercise should increase your heart rate and make you sweat (moderate-intensity exercise).  Do strengthening exercises at least twice a week. This is in addition to the moderate-intensity exercise.  Spend less time sitting. Even light physical activity can be beneficial. Watch cholesterol and blood lipids Have your blood tested for lipids and cholesterol at 29 years of age, then have this test every 5 years. Have your cholesterol levels checked more often if:  Your lipid or cholesterol levels are high.  You are older than 29 years of age.  You are at high risk for heart disease. What should I know about cancer screening? Depending on your health history and family history, you may need to have cancer screening at various ages. This may include screening for:  Breast cancer.  Cervical cancer.  Colorectal cancer.  Skin cancer.  Lung cancer. What should I know about heart disease, diabetes, and high blood  pressure? Blood pressure and heart disease  High blood pressure causes heart disease and increases the risk of stroke. This is more likely to develop in people who have high blood pressure readings, are of African descent, or are overweight.  Have your blood pressure checked: ? Every 3-5 years if you are 18-39 years of age. ? Every year if you are 40 years old or older. Diabetes Have regular diabetes screenings. This checks your fasting blood sugar level. Have the screening done:  Once every three years after age 40 if you are at a normal weight and have a low risk for diabetes.  More often and at a younger age if you are overweight or have a high risk for diabetes. What should I know about preventing infection? Hepatitis B If you have a higher risk for hepatitis B, you should be screened for this virus. Talk with your health care provider to find out if you are at risk for hepatitis B infection. Hepatitis C Testing is recommended for:  Everyone born from 1945 through 1965.  Anyone with known risk factors for hepatitis C. Sexually transmitted infections (STIs)  Get screened for STIs, including gonorrhea and chlamydia, if: ? You are sexually active and are younger than 29 years of age. ? You are older than 29 years of age and your health care provider tells you that you are at risk for this type of infection. ? Your sexual activity has changed since you were last screened, and you are at increased risk for chlamydia or gonorrhea. Ask your health care provider if   you are at risk.  Ask your health care provider about whether you are at high risk for HIV. Your health care provider may recommend a prescription medicine to help prevent HIV infection. If you choose to take medicine to prevent HIV, you should first get tested for HIV. You should then be tested every 3 months for as long as you are taking the medicine. Pregnancy  If you are about to stop having your period (premenopausal) and  you may become pregnant, seek counseling before you get pregnant.  Take 400 to 800 micrograms (mcg) of folic acid every day if you become pregnant.  Ask for birth control (contraception) if you want to prevent pregnancy. Osteoporosis and menopause Osteoporosis is a disease in which the bones lose minerals and strength with aging. This can result in bone fractures. If you are 65 years old or older, or if you are at risk for osteoporosis and fractures, ask your health care provider if you should:  Be screened for bone loss.  Take a calcium or vitamin D supplement to lower your risk of fractures.  Be given hormone replacement therapy (HRT) to treat symptoms of menopause. Follow these instructions at home: Lifestyle  Do not use any products that contain nicotine or tobacco, such as cigarettes, e-cigarettes, and chewing tobacco. If you need help quitting, ask your health care provider.  Do not use street drugs.  Do not share needles.  Ask your health care provider for help if you need support or information about quitting drugs. Alcohol use  Do not drink alcohol if: ? Your health care provider tells you not to drink. ? You are pregnant, may be pregnant, or are planning to become pregnant.  If you drink alcohol: ? Limit how much you use to 0-1 drink a day. ? Limit intake if you are breastfeeding.  Be aware of how much alcohol is in your drink. In the [place] one drink equals one 12 oz bottle of beer (355 mL), one 5 oz glass of wine (148 mL), or one 1 oz glass of hard liquor (44 mL). General instructions  Schedule regular health, dental, and eye exams.  Stay current with your vaccines.  Tell your health care provider if: ? You often feel depressed. ? You have ever been abused or do not feel safe at home. Summary  Adopting a healthy lifestyle and getting preventive care are important in promoting health and wellness.  Follow your health care provider's instructions about healthy  diet, exercising, and getting tested or screened for diseases.  Follow your health care provider's instructions on monitoring your cholesterol and blood pressure. This information is not intended to replace advice given to you by your health care provider. Make sure you discuss any questions you have with your health care provider. Document Released: 07/26/2010 Document Revised: 01/03/2018 Document Reviewed: 01/03/2018 Elsevier Patient Education  2020 [employer] Inc.  

## 2019-01-15 NOTE — Progress Notes (Signed)
Subjective:    Patient ID: Alicia [name], female    DOB: [birth year], 28 y.o.   MRN: [id number]  HPI  Pt presents to the clinic today for her annual exam.  GAD with Panic Disorder: Perisistent, managed on Sertraline. She is not currently seeing a therapist. She denies SI/HI.  Flu: 11/2018 Tetanus: 11/2018 Pap Smear: 07/2017 Dentist: as needed  Diet: She does eat meat. She consumes fruits and veggies daily. She tries to avoid fried foods. She drinks mostly water or juice. Exercise: Strength training 6 days per week  Review of Systems      Past Medical History:  Diagnosis Date  . Childhood asthma   . Frequent headaches   . Headache   . Medical history non-contributory   . 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 Ferrous Sulfate (IRON) 325 (65 Fe) MG TABS Take by mouth.    Colleen Can 1/20 1-20 MG-MCG tablet Take 1 tablet by mouth daily.  11  . nitrofurantoin, macrocrystal-monohydrate, (MACROBID) 100 MG capsule Take 1 capsule (100 mg total) by mouth 2 (two) times daily. 10 capsule 0  . sertraline (ZOLOFT) 50 MG tablet Take 1 tablet (50 mg total) by mouth daily. MUST SCHEDULE PHYSICAL EXAM 90 tablet 0   No current facility-administered medications for this visit.    Allergies  Allergen Reactions  . Lexapro [Escitalopram Oxalate] Diarrhea and Nausea Only    Headache    Family History  Problem Relation Age of Onset  . Hypertension Mother   . Diabetes Maternal Grandmother   . Hyperlipidemia Maternal Grandmother   . Other Cousin        duplicate 15 Q syndrome  . COPD Father   . Asthma Maternal Grandfather   . COPD Maternal Grandfather   . Hyperlipidemia Maternal Grandfather   . Dementia Paternal Grandmother   . Alzheimer's disease Paternal Grandfather     Social History   Socioeconomic History  . Marital status: Single    Spouse name: Not on file    . Number of children: Not on file  . Years of education: Not on file  . Highest education level: Not on file  Occupational History  . Not on file  Tobacco Use  . Smoking status: Never Smoker  . Smokeless tobacco: Never Used  Substance and Sexual Activity  . Alcohol use: Yes    Comment: rare  . Drug use: No  . Sexual activity: Yes    Birth control/protection: Pill  Other Topics Concern  . Not on file  Social History Narrative  . Not on file   Social Determinants of Health   Financial Resource Strain:   . Difficulty of Paying Living Expenses: Not on file  Food Insecurity:   . Worried About Programme researcher, broadcasting/film/video in the Last Year: Not on file  . Ran Out of Food in the Last Year: Not on file  Transportation Needs:   . Lack of Transportation (Medical): Not on file  . Lack of Transportation (Non-Medical): Not on file  Physical Activity:   . Days of Exercise per Week: Not on file  . Minutes of Exercise per Session: Not on file  Stress:   . Feeling of Stress : Not on file  Social Connections:   . Frequency of Communication with Friends and Family: Not on file  . Frequency of Social Gatherings with Friends  and Family: Not on file  . Attends Religious Services: Not on file  . Active Member of Clubs or Organizations: Not on file  . Attends Archivist Meetings: Not on file  . Marital Status: Not on file  Intimate Partner Violence:   . Fear of Current or Ex-Partner: Not on file  . Emotionally Abused: Not on file  . Physically Abused: Not on file  . Sexually Abused: Not on file     Constitutional: Denies fever, malaise, fatigue, headache or abrupt weight changes.  HEENT: Denies eye pain, eye redness, ear pain, ringing in the ears, wax buildup, runny nose, nasal congestion, bloody nose, or sore throat. Respiratory: Denies difficulty breathing, shortness of breath, cough or sputum production.   Cardiovascular: Denies chest pain, chest tightness, palpitations or swelling  in the hands or feet.  Gastrointestinal: Denies abdominal pain, bloating, constipation, diarrhea or blood in the stool.  GU: Pt reports irregular periods. Denies urgency, frequency, pain with urination, burning sensation, blood in urine, odor or discharge. Musculoskeletal: Denies decrease in range of motion, difficulty with gait, muscle pain or joint pain and swelling.  Skin: Denies redness, rashes, lesions or ulcercations.  Neurological: Denies dizziness, difficulty with memory, difficulty with speech or problems with balance and coordination.  Psych: Pt reports anxiety. Denies depression, SI/HI.  No other specific complaints in a complete review of systems (except as listed in HPI above).  Objective:   Physical Exam  BP 120/80   Pulse 67   Temp 98.4 F (36.9 C) (Temporal)   Ht 5' 5.66" (1.668 m)   Wt 147 lb (66.7 kg)   LMP 07/22/2018   SpO2 98%   BMI 23.97 kg/m   Wt Readings from Last 3 Encounters:  12/17/18 150 lb (68 kg)  12/10/18 150 lb (68 kg)  09/29/18 137 lb (62.1 kg)    General: Appears her stated age, well developed, well nourished in NAD. Skin: Warm, dry and intact. No rashes noted. HEENT: Head: normal shape and size; Eyes: sclera white, no icterus, conjunctiva pink, PERRLA and EOMs intact;  Neck:  Neck supple, trachea midline. No masses, lumps or thyromegaly present.  Cardiovascular: Normal rate and rhythm. S1,S2 noted.  No murmur, rubs or gallops noted. No JVD or BLE edema.  Pulmonary/Chest: Normal effort and positive vesicular breath sounds. No respiratory distress. No wheezes, rales or ronchi noted.  Abdomen: Soft and nontender. Normal bowel sounds. No distention or masses noted. Liver, spleen and kidneys non palpable. Musculoskeletal: Strength 5/5 BUE/BLE. No difficulty with gait.  Neurological: Alert and oriented. Cranial nerves II-XII grossly intact. Coordination normal.  Psychiatric: Mood and affect normal. Behavior is normal. Judgment and thought content  normal.     BMET    Component Value Date/Time   NA 140 10/12/2017 1237   K 4.2 10/12/2017 1237   CL 106 10/12/2017 1237   CO2 28 10/12/2017 1237   GLUCOSE 83 10/12/2017 1237   BUN 7 10/12/2017 1237   CREATININE 0.84 10/12/2017 1237   CALCIUM 9.2 10/12/2017 1237    Lipid Panel     Component Value Date/Time   CHOL 148 10/12/2017 1237   TRIG 91.0 10/12/2017 1237   HDL 44.40 10/12/2017 1237   CHOLHDL 3 10/12/2017 1237   VLDL 18.2 10/12/2017 1237   LDLCALC 86 10/12/2017 1237    CBC    Component Value Date/Time   WBC 7.8 01/18/2018 1003   RBC 3.95 01/18/2018 1003   HGB 12.6 01/18/2018 1003   HCT 38.4 01/18/2018  1003   PLT 274 01/18/2018 1003   MCV 97.2 01/18/2018 1003   MCH 31.9 01/18/2018 1003   MCHC 32.8 01/18/2018 1003   RDW 12.8 01/18/2018 1003    Hgb A1C Lab Results  Component Value Date   HGBA1C 5.4 10/12/2017           Assessment & Plan:   Preventative Health Maintenance:  Flu shot UTD Tetanus UTD Pap smear UTD Encouraged her to consume a balanced diet and exercise regimen Advised her to see a dentist annually No labs indicated, will repeat labs next year  RTC in 1 year, sooner if needed Nicki Reaperegina Elesha Thedford, NP This visit occurred during the SARS-CoV-2 public health emergency.  Safety protocols were in place, including screening questions prior to the visit, additional usage of staff PPE, and extensive cleaning of exam room while observing appropriate contact time as indicated for disinfecting solutions.

## 2019-01-15 NOTE — Assessment & Plan Note (Signed)
Stable on Sertraline Will monitor 

## 2019-01-15 NOTE — Assessment & Plan Note (Signed)
Stable on Sertraline Will monitor

## 2019-02-14 ENCOUNTER — Encounter: Payer: Self-pay | Admitting: Internal Medicine

## 2019-02-15 NOTE — Telephone Encounter (Signed)
LVM and sent msg. 

## 2019-02-20 ENCOUNTER — Other Ambulatory Visit: Payer: Self-pay

## 2019-02-20 ENCOUNTER — Ambulatory Visit (INDEPENDENT_AMBULATORY_CARE_PROVIDER_SITE_OTHER): Payer: 59 | Admitting: Internal Medicine

## 2019-02-20 ENCOUNTER — Encounter: Payer: Self-pay | Admitting: Internal Medicine

## 2019-02-20 VITALS — BP 90/60 | HR 81 | Temp 98.2°F | Ht 65.0 in | Wt 152.2 lb

## 2019-02-20 DIAGNOSIS — R112 Nausea with vomiting, unspecified: Secondary | ICD-10-CM | POA: Diagnosis not present

## 2019-02-20 DIAGNOSIS — R1031 Right lower quadrant pain: Secondary | ICD-10-CM | POA: Diagnosis not present

## 2019-02-20 LAB — POCT URINALYSIS DIPSTICK
Bilirubin, UA: NEGATIVE
Blood, UA: NEGATIVE
Glucose, UA: NEGATIVE
Ketones, UA: NEGATIVE
Leukocytes, UA: NEGATIVE
Nitrite, UA: NEGATIVE
Protein, UA: POSITIVE — AB
Spec Grav, UA: >=1.03 — AB (ref 1.010–1.025)
Urobilinogen, UA: 0.2 E.U./dL
pH, UA: 6 (ref 5.0–8.0)

## 2019-02-20 NOTE — Progress Notes (Signed)
Subjective:    Patient ID: Alicia [name], female    DOB: [birth year], 29 y.o.   MRN: [id number]  HPI  Pt presents to the clinic today with c/o right lower quadrant abdominal pain and nausea with vomiting. The nausea and vomiting started 1 month ago and occurs every morning about an hour after taking her medications. The abdominal pain began 3 years ago after her C-section and occurs occasionally with an increased frequency this week. She describes the abdominal pain as sharp, stabbing, and throbbing on the lower right side which relieves spontaneously within minutes. The pain does not radiate. She denies urinary or vaginal complaints. She takes Junel as prescribed and is not sexually active. Her LMP was 01/27/2019, this was her first menstrual period in 6 months due to taking her birth control continuously. She also reports weight gain which began 6 months ago. She has not taken anything OTC for her symptoms.   Review of Systems  Past Medical History:  Diagnosis Date  . Childhood asthma   . Frequent headaches   . Headache   . Medical history non-contributory   . 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 Ferrous Sulfate (IRON) 325 (65 Fe) MG TABS Take by mouth.    Lenda Kelp 1/20 1-20 MG-MCG tablet Take 1 tablet by mouth daily.  11  . Multiple Vitamin (MULTIVITAMIN) tablet Take 1 tablet by mouth daily.    . Probiotic Product (PROBIOTIC DAILY PO) Take by mouth.    . sertraline (ZOLOFT) 50 MG tablet Take 1 tablet (50 mg total) by mouth daily. MUST SCHEDULE PHYSICAL EXAM 90 tablet 0   No current facility-administered medications for this visit.    Allergies  Allergen Reactions  . Lexapro [Escitalopram Oxalate] Diarrhea and Nausea Only    Headache    Family History  Problem Relation Age of Onset  . Hypertension Mother   . Diabetes Maternal Grandmother   . Hyperlipidemia  Maternal Grandmother   . Other Cousin        duplicate 15 Q syndrome  . COPD Father   . Asthma Maternal Grandfather   . COPD Maternal Grandfather   . Hyperlipidemia Maternal Grandfather   . Dementia Paternal Grandmother   . Alzheimer's disease Paternal Grandfather     Social History   Socioeconomic History  . Marital status: Single    Spouse name: Not on file  . Number of children: Not on file  . Years of education: Not on file  . Highest education level: Not on file  Occupational History  . Not on file  Tobacco Use  . Smoking status: Never Smoker  . Smokeless tobacco: Never Used  Substance and Sexual Activity  . Alcohol use: Yes    Comment: rare  . Drug use: No  . Sexual activity: Yes    Birth control/protection: Pill  Other Topics Concern  . Not on file  Social History Narrative  . Not on file   Social Determinants of Health   Financial Resource Strain:   . Difficulty of Paying Living Expenses: Not on file  Food Insecurity:   . Worried About Charity fundraiser in the Last Year: Not on file  . Ran Out of Food in the Last Year: Not on file  Transportation Needs:   . Lack of Transportation (Medical): Not on file  . Lack of Transportation (Non-Medical):  Not on file  Physical Activity:   . Days of Exercise per Week: Not on file  . Minutes of Exercise per Session: Not on file  Stress:   . Feeling of Stress : Not on file  Social Connections:   . Frequency of Communication with Friends and Family: Not on file  . Frequency of Social Gatherings with Friends and Family: Not on file  . Attends Religious Services: Not on file  . Active Member of Clubs or Organizations: Not on file  . Attends Banker Meetings: Not on file  . Marital Status: Not on file  Intimate Partner Violence:   . Fear of Current or Ex-Partner: Not on file  . Emotionally Abused: Not on file  . Physically Abused: Not on file  . Sexually Abused: Not on file     Constitutional: Pt  reports weight gain. Denies fever, malaise, fatigue, or headache.  HEENT: Denies eye pain, eye redness, ear pain, ringing in the ears, wax buildup, runny nose, nasal congestion, bloody nose, or sore throat. Respiratory: Denies difficulty breathing, shortness of breath, cough or sputum production.   Cardiovascular: Denies chest pain, chest tightness, palpitations or swelling in the hands or feet.  Gastrointestinal: Pt reports abdominal pain in right lower quadrant, nausea and vomiting. Denies bloating, constipation, diarrhea or blood in the stool.  GU: Denies urgency, frequency, pain with urination, burning sensation, blood in urine, odor or discharge.   No other specific complaints in a complete review of systems (except as listed in HPI above).     Objective:   Physical Exam  BP 90/60   Pulse 81   Temp 98.2 F (36.8 C) (Temporal)   Ht 5\' 5"  (1.651 m)   Wt 152 lb 3.2 oz (69 kg)   LMP 01/27/2019 (Exact Date)   SpO2 97%   Breastfeeding Unknown   BMI 25.33 kg/m   Wt Readings from Last 3 Encounters:  01/15/19 147 lb (66.7 kg)  12/17/18 150 lb (68 kg)  12/10/18 150 lb (68 kg)    General: Appears her stated age, well developed, well nourished in NAD. Skin: Warm, dry and intact. No rashes noted. Cardiovascular: Normal rate and rhythm. S1,S2 noted.  No murmur, rubs or gallops noted.  Pulmonary/Chest: Normal effort and positive vesicular breath sounds. No respiratory distress. No wheezes, rales or ronchi noted.  Abdomen: Soft and nontender. Normal bowel sounds. No distention or masses noted. Liver, spleen and kidneys non palpable. No CVA tenderness noted. Neurological: Alert and oriented.     BMET    Component Value Date/Time   NA 140 10/12/2017 1237   K 4.2 10/12/2017 1237   CL 106 10/12/2017 1237   CO2 28 10/12/2017 1237   GLUCOSE 83 10/12/2017 1237   BUN 7 10/12/2017 1237   CREATININE 0.84 10/12/2017 1237   CALCIUM 9.2 10/12/2017 1237    Lipid Panel     Component  Value Date/Time   CHOL 148 10/12/2017 1237   TRIG 91.0 10/12/2017 1237   HDL 44.40 10/12/2017 1237   CHOLHDL 3 10/12/2017 1237   VLDL 18.2 10/12/2017 1237   LDLCALC 86 10/12/2017 1237    CBC    Component Value Date/Time   WBC 7.8 01/18/2018 1003   RBC 3.95 01/18/2018 1003   HGB 12.6 01/18/2018 1003   HCT 38.4 01/18/2018 1003   PLT 274 01/18/2018 1003   MCV 97.2 01/18/2018 1003   MCH 31.9 01/18/2018 1003   MCHC 32.8 01/18/2018 1003   RDW 12.8 01/18/2018  1003    Hgb A1C Lab Results  Component Value Date   HGBA1C 5.4 10/12/2017            Assessment & Plan:   RLQ Abdominal Pain:   Urinalysis: trace protein No need for urine culture, wet prep or pelvic exam at this time Will obtain pelvic and transvaginal ultrasound Advised that heat and NSAID's may help improve pain   Nausea and Vomiting:  Advised to discontinue Iron supplement and to take Zoloft at night rather than in the morning. If no improvements, would consider Omeprazole OTC  Return precautions discussed  Nicki Reaper, NP This visit occurred during the SARS-CoV-2 public health emergency.  Safety protocols were in place, including screening questions prior to the visit, additional usage of staff PPE, and extensive cleaning of exam room while observing appropriate contact time as indicated for disinfecting solutions.

## 2019-02-20 NOTE — Patient Instructions (Signed)

## 2019-02-26 ENCOUNTER — Ambulatory Visit
Admission: RE | Admit: 2019-02-26 | Discharge: 2019-02-26 | Disposition: A | Discharge status: Home / Self Care | Payer: 59 | Source: Ambulatory Visit | Attending: Internal Medicine | Admitting: Internal Medicine

## 2019-02-26 DIAGNOSIS — R1031 Right lower quadrant pain: Secondary | ICD-10-CM | POA: Diagnosis not present

## 2019-02-27 ENCOUNTER — Encounter: Payer: Self-pay | Admitting: Internal Medicine

## 2019-02-28 ENCOUNTER — Ambulatory Visit (INDEPENDENT_AMBULATORY_CARE_PROVIDER_SITE_OTHER): Payer: 59 | Admitting: Internal Medicine

## 2019-02-28 ENCOUNTER — Other Ambulatory Visit: Payer: Self-pay

## 2019-02-28 ENCOUNTER — Encounter: Payer: Self-pay | Admitting: Internal Medicine

## 2019-02-28 DIAGNOSIS — G43C1 Periodic headache syndromes in child or adult, intractable: Secondary | ICD-10-CM | POA: Diagnosis not present

## 2019-02-28 MED ORDER — SUMATRIPTAN SUCCINATE 25 MG PO TABS: 25.0000 mg | ORAL_TABLET | ORAL | 0 refills | Status: DC | PRN

## 2019-02-28 MED ORDER — TOPIRAMATE 25 MG PO TABS: 25.0000 mg | ORAL_TABLET | Freq: Two times a day (BID) | ORAL | 2 refills | Status: DC

## 2019-02-28 NOTE — Patient Instructions (Signed)

## 2019-02-28 NOTE — Progress Notes (Signed)
Virtual Visit via Video Note  I connected with Alicia [name] on 02/28/19 at  2:15 PM EST by a video enabled telemedicine application and verified that I am speaking with the correct person using two identifiers.  Location: Patient: Work Provider: Office   I discussed the limitations of evaluation and management by telemedicine and the availability of in person appointments. The patient expressed understanding and agreed to proceed.  History of Present Illness:  Patient wants to discuss frequent headaches. This started about a year and a half ago and occur every 5-6 days. She describes her headaches as aching and pressure of her forehead which last several days to a week at a time. She reports moderate phonophobia and photophobia but denies dizziness, visual changes, nausea or vomiting. She has tried increased water intake, Ibuprofen, Tylenol, and eEcedrin with no relief. She denies neck pain or stiffness. She has had an eye exam within the last year.  She also want to let me know that she stopped the vitamin she was taking for her bladder, and the nausea she had been experiencing recently (not related to the headaches) has resolved.    Past Medical History:  Diagnosis Date  . Childhood asthma   . Frequent headaches   . Headache   . Medical history non-contributory   . 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 Ferrous Sulfate (IRON) 325 (65 Fe) MG TABS Take by mouth.    Lenda Kelp 1/20 1-20 MG-MCG tablet Take 1 tablet by mouth daily.  11  . Multiple Vitamin (MULTIVITAMIN) tablet Take 1 tablet by mouth daily.    . Probiotic Product (PROBIOTIC DAILY PO) Take by mouth.    . sertraline (ZOLOFT) 50 MG tablet Take 1 tablet (50 mg total) by mouth daily. MUST SCHEDULE PHYSICAL EXAM 90 tablet 0   No current facility-administered medications for this visit.    Allergies  Allergen  Reactions  . Lexapro [Escitalopram Oxalate] Diarrhea and Nausea Only    Headache    Family History  Problem Relation Age of Onset  . Hypertension Mother   . Diabetes Maternal Grandmother   . Hyperlipidemia Maternal Grandmother   . Other Cousin        duplicate 15 Q syndrome  . COPD Father   . Asthma Maternal Grandfather   . COPD Maternal Grandfather   . Hyperlipidemia Maternal Grandfather   . Dementia Paternal Grandmother   . Alzheimer's disease Paternal Grandfather     Social History   Socioeconomic History  . Marital status: Single    Spouse name: Not on file  . Number of children: Not on file  . Years of education: Not on file  . Highest education level: Not on file  Occupational History  . Not on file  Tobacco Use  . Smoking status: Never Smoker  . Smokeless tobacco: Never Used  Substance and Sexual Activity  . Alcohol use: Yes    Comment: rare  . Drug use: No  . Sexual activity: Yes    Birth control/protection: Pill  Other Topics Concern  . Not on file  Social History Narrative  . Not on file   Social Determinants of Health   Financial Resource Strain:   . Difficulty of Paying Living Expenses: Not on file  Food Insecurity:   . Worried About Charity fundraiser in the Last Year: Not on file  .  Ran Out of Food in the Last Year: Not on file  Transportation Needs:   . Lack of Transportation (Medical): Not on file  . Lack of Transportation (Non-Medical): Not on file  Physical Activity:   . Days of Exercise per Week: Not on file  . Minutes of Exercise per Session: Not on file  Stress:   . Feeling of Stress : Not on file  Social Connections:   . Frequency of Communication with Friends and Family: Not on file  . Frequency of Social Gatherings with Friends and Family: Not on file  . Attends Religious Services: Not on file  . Active Member of Clubs or Organizations: Not on file  . Attends Banker Meetings: Not on file  . Marital Status: Not on  file  Intimate Partner Violence:   . Fear of Current or Ex-Partner: Not on file  . Emotionally Abused: Not on file  . Physically Abused: Not on file  . Sexually Abused: Not on file     Constitutional: Patient reports headaches.  Denies fever, malaise, fatigue, or abrupt weight changes.  HEENT: Pt reports sensitivity to light and sound. Denies eye pain, eye redness, ear pain, ringing in the ears, wax buildup, runny nose, nasal congestion, bloody nose, or sore throat Neurological: Denies dizziness, difficulty with memory, difficulty with speech or problems with balance and coordination.    No other specific complaints in a complete review of systems (except as listed in HPI above).  Observations/Objective:   HR 75 Wt Readings from Last 3 Encounters:  02/20/19 152 lb 3.2 oz (69 kg)  01/15/19 147 lb (66.7 kg)  12/17/18 150 lb (68 kg)    General: Appears her stated age, well developed, well nourished in NAD. HEENT: Head: normal shape and size; Eyes:  EOMs intact.  Pulmonary/Chest: Normal effort. No respiratory distress.   Neurological: Alert and oriented. Coordination normal.      BMET    Component Value Date/Time   NA 140 10/12/2017 1237   K 4.2 10/12/2017 1237   CL 106 10/12/2017 1237   CO2 28 10/12/2017 1237   GLUCOSE 83 10/12/2017 1237   BUN 7 10/12/2017 1237   CREATININE 0.84 10/12/2017 1237   CALCIUM 9.2 10/12/2017 1237    Lipid Panel     Component Value Date/Time   CHOL 148 10/12/2017 1237   TRIG 91.0 10/12/2017 1237   HDL 44.40 10/12/2017 1237   CHOLHDL 3 10/12/2017 1237   VLDL 18.2 10/12/2017 1237   LDLCALC 86 10/12/2017 1237    CBC    Component Value Date/Time   WBC 7.8 01/18/2018 1003   RBC 3.95 01/18/2018 1003   HGB 12.6 01/18/2018 1003   HCT 38.4 01/18/2018 1003   PLT 274 01/18/2018 1003   MCV 97.2 01/18/2018 1003   MCH 31.9 01/18/2018 1003   MCHC 32.8 01/18/2018 1003   RDW 12.8 01/18/2018 1003    Hgb A1C Lab Results  Component Value  Date   HGBA1C 5.4 10/12/2017        Assessment and Plan:  Migraines:  Rx sent for Sumatriptan 25mg   Q2H PRN for abortive therapy Rx sent for Topiramate 25mg  QHS for preventative therapy If no improvement, consider referral to neurology for further eval vs MRI brain  Return precautions discussed, advised her to update me in 2 weeks and let me know how you are doing  Follow Up Instructions:    I discussed the assessment and treatment plan with the patient. The patient was provided  an opportunity to ask questions and all were answered. The patient agreed with the plan and demonstrated an understanding of the instructions.   The patient was advised to call back or seek an in-person evaluation if the symptoms worsen or if the condition fails to improve as anticipated.     Webb Silversmith, NP

## 2019-03-08 ENCOUNTER — Other Ambulatory Visit: Payer: Self-pay | Admitting: Internal Medicine

## 2019-03-13 ENCOUNTER — Encounter: Payer: Self-pay | Admitting: Internal Medicine

## 2019-03-24 ENCOUNTER — Other Ambulatory Visit: Payer: Self-pay | Admitting: Internal Medicine

## 2019-04-08 ENCOUNTER — Ambulatory Visit: Payer: 59 | Attending: Internal Medicine

## 2019-04-08 DIAGNOSIS — Z20822 Contact with and (suspected) exposure to covid-19: Secondary | ICD-10-CM

## 2019-04-09 LAB — NOVEL CORONAVIRUS, NAA: SARS-CoV-2, NAA: NOT DETECTED

## 2019-04-10 ENCOUNTER — Other Ambulatory Visit: Payer: Self-pay | Admitting: Internal Medicine

## 2019-04-17 ENCOUNTER — Encounter: Payer: Self-pay | Admitting: Internal Medicine

## 2019-04-18 ENCOUNTER — Encounter: Payer: Self-pay | Admitting: Internal Medicine

## 2019-04-18 NOTE — Telephone Encounter (Signed)
The topamax should be daily. The Imitrex can be taken every 2 hours, max of 4 pills per day

## 2019-04-19 MED ORDER — TOPIRAMATE 25 MG PO TABS: 25.0000 mg | ORAL_TABLET | Freq: Two times a day (BID) | ORAL | 2 refills | Status: DC

## 2019-04-29 ENCOUNTER — Encounter: Payer: Self-pay | Admitting: Internal Medicine

## 2019-05-03 ENCOUNTER — Encounter: Payer: Self-pay | Admitting: Family Medicine

## 2019-05-03 ENCOUNTER — Ambulatory Visit (INDEPENDENT_AMBULATORY_CARE_PROVIDER_SITE_OTHER): Payer: 59 | Admitting: Family Medicine

## 2019-05-03 ENCOUNTER — Other Ambulatory Visit: Payer: Self-pay

## 2019-05-03 DIAGNOSIS — R053 Chronic cough: Secondary | ICD-10-CM

## 2019-05-03 DIAGNOSIS — R05 Cough: Secondary | ICD-10-CM

## 2019-05-03 MED ORDER — HYDROCODONE-HOMATROPINE 5-1.5 MG/5ML PO SYRP: 5.0000 mL | ORAL_SOLUTION | Freq: Three times a day (TID) | ORAL | 0 refills | Status: DC | PRN

## 2019-05-03 MED ORDER — PREDNISONE 10 MG PO TABS: ORAL_TABLET | ORAL | 0 refills | Status: DC

## 2019-05-03 MED ORDER — BENZONATATE 200 MG PO CAPS: 200.0000 mg | ORAL_CAPSULE | Freq: Three times a day (TID) | ORAL | 1 refills | Status: DC | PRN

## 2019-05-03 NOTE — Assessment & Plan Note (Signed)
Given history I suspect cough variant asthma triggered by uri or allergies or both   tx with prednisone taper 30 mg (disc side eff) Tessalon prn  Hycodan prn  Update if not starting to improve in a week or if worsening  Also if new symptoms develop like fever

## 2019-05-03 NOTE — Progress Notes (Signed)
Virtual Visit via Video Note  I connected with Alicia [name] on 05/03/19 at 10:30 AM EDT by a video enabled telemedicine application and verified that I am speaking with the correct person using two identifiers.  Location: Patient: car  Provider: office   I discussed the limitations of evaluation and management by telemedicine and the availability of in person appointments. The patient expressed understanding and agreed to proceed.  Parties involved in encounter  Patient: Alicia [name]  Provider:  Roxy Manns MD    History of Present Illness: 29 yo pt of Nicki Reaper presents with a cough   Has had it for about 2 months  In the beginning she felt tired Never had a fever  Some runny nose /allergies (takes antihistamines) - has taken 2 different antihistamines  No congestion   Cough is persistent and all day long  occ painful  No wheezing or tightness  Gets winded more easily lately (cannot do her regular exercises)   She had asthma as a child  This sometimes happens during allergy symptoms - then cough persists   No fever  Does not feel sick  Has had 3 covid tests and all are neg  No loss of taste /smell No GI symptoms  No headaches  No ST   Otc: antihistamine  mucinex DM max- did not help    Review of Systems  Constitutional: Negative for chills, fever and malaise/fatigue.  HENT: Negative for congestion, ear pain, sinus pain and sore throat.   Eyes: Negative for blurred vision, discharge and redness.  Respiratory: Positive for cough and shortness of breath. Negative for hemoptysis, sputum production, wheezing and stridor.   Cardiovascular: Negative for chest pain, palpitations and leg swelling.  Gastrointestinal: Negative for abdominal pain, diarrhea, nausea and vomiting.  Musculoskeletal: Negative for myalgias.  Skin: Negative for rash.  Neurological: Negative for dizziness and headaches.     Patient Active Problem List   Diagnosis Date Noted  . Persistent  cough 05/03/2019  . GAD (generalized anxiety disorder) 03/29/2018  . Panic disorder 08/16/2017   Past Medical History:  Diagnosis Date  . Childhood asthma   . Frequent headaches   . Headache   . Medical history non-contributory   . 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 CESAREAN SECTION N/A 05/25/2016   Procedure: CESAREAN SECTION;  Surgeon: Mitchel Honour, DO;  Location: [facility];  Service: Obstetrics;  Laterality: N/A;  PRIMARY EDC 05/27/16 NKDA Odelia Gage, RNFA  . NO PAST SURGERIES     Social History   Tobacco Use  . Smoking status: Never Smoker  . Smokeless tobacco: Never Used  Substance Use Topics  . Alcohol use: Yes    Comment: rare  . Drug use: No   Family History  Problem Relation Age of Onset  . Hypertension Mother   . Diabetes Maternal Grandmother   . Hyperlipidemia Maternal Grandmother   . Other Cousin        duplicate 15 Q syndrome  . COPD Father   . Asthma Maternal Grandfather   . COPD Maternal Grandfather   . Hyperlipidemia Maternal Grandfather   . Dementia Paternal Grandmother   . Alzheimer's disease Paternal Grandfather    Allergies  Allergen Reactions  . Lexapro [Escitalopram Oxalate] Diarrhea and Nausea Only    Headache   Current Outpatient Medications on File Prior to Visit  Medication Sig Dispense  Refill  . Ferrous Sulfate (IRON) 325 (65 Fe) MG TABS Take by mouth.    Lenda Kelp 1/20 1-20 MG-MCG tablet Take 1 tablet by mouth daily.  11  . Multiple Vitamin (MULTIVITAMIN) tablet Take 1 tablet by mouth daily.    . Probiotic Product (PROBIOTIC DAILY PO) Take by mouth.    . sertraline (ZOLOFT) 50 MG tablet Take 1 tablet (50 mg total) by mouth daily. 90 tablet 1  . SUMAtriptan (IMITREX) 25 MG tablet Take 1 tablet (25 mg total) by mouth every 2 (two) hours as needed for migraine. May repeat in 2 hours if headache persists or recurs. 10 tablet 0  .  topiramate (TOPAMAX) 25 MG tablet Take 1 tablet (25 mg total) by mouth 2 (two) times daily. 60 tablet 2   No current facility-administered medications on file prior to visit.    Observations/Objective: Patient appears well, in no distress Weight is baseline  No facial swelling or asymmetry Normal voice-not hoarse and no slurred speech No obvious tremor or mobility impairment Moving neck and UEs normally Able to hear the call well  No wheeze or shortness of breath during interview , forced expiration sounds normal Cough sounds dry/hacky today  Talkative and mentally sharp with no cognitive changes No skin changes on face or neck , no rash or pallor Affect is normal    Assessment and Plan: Problem List Items Addressed This Visit      Other   Persistent cough    Given history I suspect cough variant asthma triggered by uri or allergies or both   tx with prednisone taper 30 mg (disc side eff) Tessalon prn  Hycodan prn  Update if not starting to improve in a week or if worsening  Also if new symptoms develop like fever          Follow Up Instructions: I think you may have reactive airways triggered by allergies (possible cough variant asthma)   Let's try a course of prednisone (this may make you feel hyper and hungry but it is temporary)  Tessalon and hycodan for cough (use hycodan with caution of sedation)  Update if not starting to improve in a week or if worsening   Also if any new symptoms    I discussed the assessment and treatment plan with the patient. The patient was provided an opportunity to ask questions and all were answered. The patient agreed with the plan and demonstrated an understanding of the instructions.   The patient was advised to call back or seek an in-person evaluation if the symptoms worsen or if the condition fails to improve as anticipated.   Loura Pardon, MD

## 2019-05-03 NOTE — Patient Instructions (Signed)
I think you may have reactive airways triggered by allergies (possible cough variant asthma)   Let's try a course of prednisone (this may make you feel hyper and hungry but it is temporary)  Tessalon and hycodan for cough (use hycodan with caution of sedation)  Update if not starting to improve in a week or if worsening   Also if any new symptoms

## 2019-05-16 ENCOUNTER — Encounter: Payer: Self-pay | Admitting: Family Medicine

## 2019-05-19 ENCOUNTER — Other Ambulatory Visit: Payer: Self-pay

## 2019-05-19 ENCOUNTER — Ambulatory Visit (HOSPITAL_COMMUNITY)
Admission: EM | Admit: 2019-05-19 | Discharge: 2019-05-19 | Disposition: A | Discharge status: Home / Self Care | Payer: 59 | Attending: Family Medicine | Admitting: Family Medicine

## 2019-05-19 ENCOUNTER — Ambulatory Visit (INDEPENDENT_AMBULATORY_CARE_PROVIDER_SITE_OTHER): Payer: 59

## 2019-05-19 DIAGNOSIS — J019 Acute sinusitis, unspecified: Secondary | ICD-10-CM | POA: Diagnosis not present

## 2019-05-19 DIAGNOSIS — R053 Chronic cough: Secondary | ICD-10-CM

## 2019-05-19 DIAGNOSIS — R05 Cough: Secondary | ICD-10-CM

## 2019-05-19 DIAGNOSIS — R059 Cough, unspecified: Secondary | ICD-10-CM

## 2019-05-19 DIAGNOSIS — R0981 Nasal congestion: Secondary | ICD-10-CM

## 2019-05-19 DIAGNOSIS — Z8709 Personal history of other diseases of the respiratory system: Secondary | ICD-10-CM

## 2019-05-19 MED ORDER — AMOXICILLIN-POT CLAVULANATE 875-125 MG PO TABS: 1.0000 | ORAL_TABLET | Freq: Two times a day (BID) | ORAL | 0 refills | Status: DC

## 2019-05-19 NOTE — ED Triage Notes (Signed)
Pt states she has has a cough for 2 months. Pt states she has been tested for Covid. Pt states she has been taking musinex DM and its not working.

## 2019-05-19 NOTE — ED Provider Notes (Signed)
[place]   MRN: [id number] DOB: Apr 22, 1990  Subjective:   AMEIRA ALESSANDRINI is a 29 y.o. female presenting for 2 month hx of persistent cough, mostly dry but worse and becoming productive today. No hemoptysis. Has had throat pain from her cough, difficulty taking deep breath after coughing fits. Has gone to multiple other providers for the same. Started a steroid course ~1 week ago and had intermittent improvement. Has not been used any antibiotics. Has also used benzonatate, hydrocodone homatropine intermittently with relief. Has been using Mucinex without relief. Has gotten relief with congestion using Mucinex. COVID 19 testing was negative. Denies smoking cigarettes, marijuana. Had asthma as a child. Does not have an inhaler. Has had tooth pulled on Friday. Has had ear popping bilaterally.   No current facility-administered medications for this encounter.  Current Outpatient Medications:  .  benzonatate (TESSALON) 200 MG capsule, Take 1 capsule (200 mg total) by mouth 3 (three) times daily as needed. Swallow whole, do not bite pill, Disp: 30 capsule, Rfl: 1 .  Ferrous Sulfate (IRON) 325 (65 Fe) MG TABS, Take by mouth., Disp: , Rfl:  .  JUNEL 1/20 1-20 MG-MCG tablet, Take 1 tablet by mouth daily., Disp: , Rfl: 11 .  Multiple Vitamin (MULTIVITAMIN) tablet, Take 1 tablet by mouth daily., Disp: , Rfl:  .  Probiotic Product (PROBIOTIC DAILY PO), Take by mouth., Disp: , Rfl:  .  SUMAtriptan (IMITREX) 25 MG tablet, Take 1 tablet (25 mg total) by mouth every 2 (two) hours as needed for migraine. May repeat in 2 hours if headache persists or recurs. (Patient not taking: Reported on 05/19/2019), Disp: 10 tablet, Rfl: 0 .  topiramate (TOPAMAX) 25 MG tablet, Take 1 tablet (25 mg total) by mouth 2 (two) times daily., Disp: 60 tablet, Rfl: 2   Allergies  Allergen Reactions  . Lexapro [Escitalopram Oxalate] Diarrhea and Nausea Only    Headache    Past Medical History:  Diagnosis Date  .  Childhood asthma   . Frequent headaches   . Headache   . Medical history non-contributory   . 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 CESAREAN SECTION N/A 05/25/2016   Procedure: CESAREAN SECTION;  Surgeon: Linda Hedges, DO;  Location: [facility];  Service: Obstetrics;  Laterality: N/A;  PRIMARY EDC 05/27/16 NKDA Jill Side, RNFA  . NO PAST SURGERIES      Family History  Problem Relation Age of Onset  . Hypertension Mother   . Diabetes Maternal Grandmother   . Hyperlipidemia Maternal Grandmother   . Other Cousin        duplicate 15 Q syndrome  . COPD Father   . Asthma Maternal Grandfather   . COPD Maternal Grandfather   . Hyperlipidemia Maternal Grandfather   . Dementia Paternal Grandmother   . Alzheimer's disease Paternal Grandfather     Social History   Tobacco Use  . Smoking status: Never Smoker  . Smokeless tobacco: Never Used  Substance Use Topics  . Alcohol use: Yes    Comment: rare  . Drug use: No    ROS   Objective:   Vitals: BP 117/79 (BP Location: Right Arm)   Pulse 92   Temp 98.8 F (37.1 C) (Oral)   Resp 18   Wt 149 lb (67.6 kg)   SpO2 92%   BMI 24.79 kg/m   Pulse oximetry  was 97% on recheck by PA-Glendel Jaggers.   Physical Exam Constitutional:      General: She is not in acute distress.    Appearance: Normal appearance. She is well-developed. She is not ill-appearing, toxic-appearing or diaphoretic.  HENT:     Head: Normocephalic and atraumatic.     Right Ear: Tympanic membrane and ear canal normal. No drainage or tenderness. No middle ear effusion. Tympanic membrane is not erythematous.     Left Ear: Tympanic membrane and ear canal normal. No drainage or tenderness.  No middle ear effusion. Tympanic membrane is not erythematous.     Ears:     Comments: Erythematous nasal mucosa.    Nose: Nose normal. No congestion or rhinorrhea.      Mouth/Throat:     Mouth: Mucous membranes are moist. No oral lesions.     Pharynx: Oropharynx is clear. No pharyngeal swelling, oropharyngeal exudate, posterior oropharyngeal erythema or uvula swelling.     Tonsils: No tonsillar exudate or tonsillar abscesses.  Eyes:     Extraocular Movements: Extraocular movements intact.     Right eye: Normal extraocular motion.     Left eye: Normal extraocular motion.     Conjunctiva/sclera: Conjunctivae normal.     Pupils: Pupils are equal, round, and reactive to light.  Cardiovascular:     Rate and Rhythm: Normal rate and regular rhythm.     Pulses: Normal pulses.     Heart sounds: Normal heart sounds. No murmur. No friction rub. No gallop.   Pulmonary:     Effort: Pulmonary effort is normal. No respiratory distress.     Breath sounds: Normal breath sounds. No stridor. No wheezing, rhonchi or rales.  Musculoskeletal:     Cervical back: Normal range of motion and neck supple.  Lymphadenopathy:     Cervical: No cervical adenopathy.  Skin:    General: Skin is warm and dry.     Findings: No rash.  Neurological:     General: No focal deficit present.     Mental Status: She is alert and oriented to person, place, and time.  Psychiatric:        Mood and Affect: Mood normal.        Behavior: Behavior normal.        Thought Content: Thought content normal.        Judgment: Judgment normal.    DG Chest 2 View  Result Date: 05/19/2019 CLINICAL DATA:  Persistent cough EXAM: CHEST - 2 VIEW COMPARISON:  None. FINDINGS: The heart size and mediastinal contours are within normal limits. Both lungs are clear. The visualized skeletal structures are unremarkable. IMPRESSION: No active cardiopulmonary disease. Electronically Signed   By: Gerome Sam III M.D   On: 05/19/2019 16:41   Assessment and Plan :   PDMP not reviewed this encounter.  1. Acute non-recurrent sinusitis, unspecified location   2. Cough   3. Persistent cough   4. Nasal congestion     5. History of asthma     Given normal cxr, ongoing nasal congestion will address for sinusitis. Patient declined COVID 19 testing and repeat steroid course. Counseled patient on potential for adverse effects with medications prescribed/recommended today, ER and return-to-clinic precautions discussed, patient verbalized understanding.    Wallis Bamberg, [place] 05/19/19 1709

## 2019-05-27 ENCOUNTER — Ambulatory Visit: Payer: 59 | Attending: Internal Medicine

## 2019-05-27 DIAGNOSIS — Z20822 Contact with and (suspected) exposure to covid-19: Secondary | ICD-10-CM

## 2019-05-29 LAB — NOVEL CORONAVIRUS, NAA: SARS-CoV-2, NAA: NOT DETECTED

## 2019-05-29 LAB — SARS-COV-2, NAA 2 DAY TAT

## 2019-06-03 ENCOUNTER — Encounter: Payer: Self-pay | Admitting: Internal Medicine

## 2019-06-03 MED ORDER — TOPIRAMATE 25 MG PO TABS: 25.0000 mg | ORAL_TABLET | Freq: Two times a day (BID) | ORAL | 2 refills | Status: DC

## 2019-06-03 NOTE — Telephone Encounter (Signed)
I pulled down Topamax for refill. Last time it was sent to [facility] that she does not use. Please review. Updated pharmacy information in the chart

## 2019-06-03 NOTE — Telephone Encounter (Signed)
She is not being taken off. I will send Topamax to [facility] on file.

## 2019-06-26 ENCOUNTER — Encounter: Payer: Self-pay | Admitting: Internal Medicine

## 2019-06-27 ENCOUNTER — Other Ambulatory Visit: Payer: Self-pay

## 2019-06-27 ENCOUNTER — Ambulatory Visit (INDEPENDENT_AMBULATORY_CARE_PROVIDER_SITE_OTHER): Payer: 59 | Admitting: Internal Medicine

## 2019-06-27 ENCOUNTER — Encounter: Payer: Self-pay | Admitting: Internal Medicine

## 2019-06-27 VITALS — BP 116/78 | HR 63 | Temp 98.9°F | Wt 151.0 lb

## 2019-06-27 DIAGNOSIS — N921 Excessive and frequent menstruation with irregular cycle: Secondary | ICD-10-CM

## 2019-06-27 NOTE — Progress Notes (Signed)
Subjective:    Patient ID: Alicia [name], female    DOB: 1990-05-15, 29 y.o.   MRN: [id number]  HPI  Patient presents to the clinic today with complaint of irregular menstrual bleeding and pelvic cramping.  She reports her last menstrual period was 5/14 and 5/18.  She reports she restarted her menses again yesterday.  She reports that the cramps feel as bad as Braxton Hicks contractions when she was pregnant.  She also reports increased mood issues especially agitation.  She denies urinary or other vaginal complaints. She has been taking her OCPs as prescribed. Her last pap smear was 08/2018. She has not been sexually active since 4/4.  Review of Systems      Past Medical History:  Diagnosis Date  . Childhood asthma   . Frequent headaches   . Headache   . Medical history non-contributory   . 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 amoxicillin-clavulanate (AUGMENTIN) 875-125 MG tablet Take 1 tablet by mouth every 12 (twelve) hours. 14 tablet 0  . benzonatate (TESSALON) 200 MG capsule Take 1 capsule (200 mg total) by mouth 3 (three) times daily as needed. Swallow whole, do not bite pill 30 capsule 1  . Ferrous Sulfate (IRON) 325 (65 Fe) MG TABS Take by mouth.    Colleen Can 1/20 1-20 MG-MCG tablet Take 1 tablet by mouth daily.  11  . Multiple Vitamin (MULTIVITAMIN) tablet Take 1 tablet by mouth daily.    . Probiotic Product (PROBIOTIC DAILY PO) Take by mouth.    . SUMAtriptan (IMITREX) 25 MG tablet Take 1 tablet (25 mg total) by mouth every 2 (two) hours as needed for migraine. May repeat in 2 hours if headache persists or recurs. (Patient not taking: Reported on 05/19/2019) 10 tablet 0  . topiramate (TOPAMAX) 25 MG tablet Take 1 tablet (25 mg total) by mouth 2 (two) times daily. 60 tablet 2   No current facility-administered medications for this visit.    Allergies   Allergen Reactions  . Lexapro [Escitalopram Oxalate] Diarrhea and Nausea Only    Headache    Family History  Problem Relation Age of Onset  . Hypertension Mother   . Diabetes Maternal Grandmother   . Hyperlipidemia Maternal Grandmother   . Other Cousin        duplicate 15 Q syndrome  . COPD Father   . Asthma Maternal Grandfather   . COPD Maternal Grandfather   . Hyperlipidemia Maternal Grandfather   . Dementia Paternal Grandmother   . Alzheimer's disease Paternal Grandfather     Social History   Socioeconomic History  . Marital status: Single    Spouse name: Not on file  . Number of children: Not on file  . Years of education: Not on file  . Highest education level: Not on file  Occupational History  . Not on file  Tobacco Use  . Smoking status: Never Smoker  . Smokeless tobacco: Never Used  Substance and Sexual Activity  . Alcohol use: Yes    Comment: rare  . Drug use: No  . Sexual activity: Yes    Birth control/protection: Pill  Other Topics Concern  . Not on file  Social History Narrative  . Not on file   Social Determinants of Health   Financial Resource Strain:   . Difficulty of Paying Living Expenses:   Food Insecurity:   .  Worried About Charity fundraiser in the Last Year:   . Arboriculturist in the Last Year:   Transportation Needs:   . Film/video editor (Medical):   Marland Kitchen Lack of Transportation (Non-Medical):   Physical Activity:   . Days of Exercise per Week:   . Minutes of Exercise per Session:   Stress:   . Feeling of Stress :   Social Connections:   . Frequency of Communication with Friends and Family:   . Frequency of Social Gatherings with Friends and Family:   . Attends Religious Services:   . Active Member of Clubs or Organizations:   . Attends Archivist Meetings:   Marland Kitchen Marital Status:   Intimate Partner Violence:   . Fear of Current or Ex-Partner:   . Emotionally Abused:   Marland Kitchen Physically Abused:   . Sexually Abused:       Constitutional: Denies fever, malaise, fatigue, headache or abrupt weight changes.  Respiratory: Denies difficulty breathing, shortness of breath, cough or sputum production.   Cardiovascular: Denies chest pain, chest tightness, palpitations or swelling in the hands or feet.  Gastrointestinal: Denies abdominal pain, bloating, constipation, diarrhea or blood in the stool.  GU: Patient reports irregular menses, pelvic cramping.  Denies urgency, frequency, pain with urination, burning sensation, blood in urine, odor or discharge. Psych: Patient reports increased moodiness and agitation.  Denies anxiety, depression, SI/HI.  No other specific complaints in a complete review of systems (except as listed in HPI above).  Objective:   Physical Exam BP 116/78   Pulse 63   Temp 98.9 F (37.2 C) (Temporal)   Wt 151 lb (68.5 kg)   LMP 06/25/2019   SpO2 98%   BMI 25.13 kg/m   Wt Readings from Last 3 Encounters:  05/19/19 149 lb (67.6 kg)  02/20/19 152 lb 3.2 oz (69 kg)  01/15/19 147 lb (66.7 kg)    General: Appears her stated age, well developed, well nourished in NAD. Neck:  Neck supple, trachea midline. No masses, lumps or thyromegaly present.  Cardiovascular: Normal rate and rhythm. S1,S2 noted.  No murmur, rubs or gallops noted.  Pulmonary/Chest: Normal effort and positive vesicular breath sounds. No respiratory distress. No wheezes, rales or ronchi noted.  Abdomen: Soft and nontender. Normal bowel sounds. No distention or masses noted.  Pelvic: Deferred, having vaginal bleeding. Neurological: Alert and oriented.   BMET    Component Value Date/Time   NA 140 10/12/2017 1237   K 4.2 10/12/2017 1237   CL 106 10/12/2017 1237   CO2 28 10/12/2017 1237   GLUCOSE 83 10/12/2017 1237   BUN 7 10/12/2017 1237   CREATININE 0.84 10/12/2017 1237   CALCIUM 9.2 10/12/2017 1237    Lipid Panel     Component Value Date/Time   CHOL 148 10/12/2017 1237   TRIG 91.0 10/12/2017 1237   HDL  44.40 10/12/2017 1237   CHOLHDL 3 10/12/2017 1237   VLDL 18.2 10/12/2017 1237   LDLCALC 86 10/12/2017 1237    CBC    Component Value Date/Time   WBC 7.8 01/18/2018 1003   RBC 3.95 01/18/2018 1003   HGB 12.6 01/18/2018 1003   HCT 38.4 01/18/2018 1003   PLT 274 01/18/2018 1003   MCV 97.2 01/18/2018 1003   MCH 31.9 01/18/2018 1003   MCHC 32.8 01/18/2018 1003   RDW 12.8 01/18/2018 1003    Hgb A1C Lab Results  Component Value Date   HGBA1C 5.4 10/12/2017  Assessment & Plan:   Menorrhagia with Irregular Cycle:  Will check CBC, TSH, FSH/LH Consider increasing Junel to 1/35 Ultrasound from 02/2019 reviewed  Return precautions discussed Nicki Reaper, NP This visit occurred during the SARS-CoV-2 public health emergency.  Safety protocols were in place, including screening questions prior to the visit, additional usage of staff PPE, and extensive cleaning of exam room while observing appropriate contact time as indicated for disinfecting solutions.

## 2019-06-27 NOTE — Patient Instructions (Signed)
Menorrhagia Menorrhagia is when your menstrual periods are heavy or last longer than normal. Follow these instructions at home: Medicines   Take over-the-counter and prescription medicines exactly as told by your doctor. This includes iron pills.  Do not change or switch medicines without asking your doctor.  Do not take aspirin or medicines that contain aspirin 1 week before or during your period. Aspirin may make bleeding worse. General instructions  If you need to change your pad or tampon more than once every 2 hours, limit your activity until the bleeding stops.  Iron pills can cause problems when pooping (constipation). To prevent or treat pooping problems while taking prescription iron pills, your doctor may suggest that you: ? Drink enough fluid to keep your pee (urine) clear or pale yellow. ? Take over-the-counter or prescription medicines. ? Eat foods that are high in fiber. These foods include:  Fresh fruits and vegetables.  Whole grains.  Beans. ? Limit foods that are high in fat and processed sugars. This includes fried and sweet foods.  Eat healthy meals and foods that are high in iron. Foods that have a lot of iron include: ? Leafy green vegetables. ? Meat. ? Liver. ? Eggs. ? Whole grain breads and cereals.  Do not try to lose weight until your heavy bleeding has stopped and you have normal amounts of iron in your blood. If you need to lose weight, work with your doctor.  Keep all follow-up visits as told by your doctor. This is important. Contact a doctor if:  You soak through a pad or tampon every 1 or 2 hours, and this happens every time you have a period.  You need to use pads and tampons at the same time because you are bleeding so much.  You are taking medicine and you: ? Feel sick to your stomach (nauseous). ? Throw up (vomit). ? Have watery poop (diarrhea).  You have other problems that may be related to the medicine you are taking. Get help  right away if:  You soak through more than a pad or tampon in 1 hour.  You pass clots bigger than 1 inch (2.5 cm) wide.  You feel short of breath.  You feel like your heart is beating too fast.  You feel dizzy or you pass out (faint).  You feel very weak or tired. Summary  Menorrhagia is when your menstrual periods are heavy or last longer than normal.  Take over-the-counter and prescription medicines exactly as told by your doctor. This includes iron pills.  Contact a doctor if you soak through more than a pad or tampon in 1 hour or are passing large clots. This information is not intended to replace advice given to you by your health care provider. Make sure you discuss any questions you have with your health care provider. Document Revised: 04/19/2017 Document Reviewed: 02/01/2016 Elsevier Patient Education  2020 [employer]  

## 2019-06-28 ENCOUNTER — Encounter: Payer: Self-pay | Admitting: Internal Medicine

## 2019-06-28 LAB — CBC
HCT: 36.4 % (ref 36.0–46.0)
Hemoglobin: 12.3 g/dL (ref 12.0–15.0)
MCHC: 33.8 g/dL (ref 30.0–36.0)
MCV: 94.2 fl (ref 78.0–100.0)
Platelets: 340 10*3/uL (ref 150.0–400.0)
RBC: 3.86 Mil/uL — ABNORMAL LOW (ref 3.87–5.11)
RDW: 13.1 % (ref 11.5–15.5)
WBC: 8.8 10*3/uL (ref 4.0–10.5)

## 2019-06-28 LAB — FOLLICLE STIMULATING HORMONE: FSH: 5.4 m[IU]/mL

## 2019-06-28 LAB — LUTEINIZING HORMONE: LH: 2.63 m[IU]/mL

## 2019-06-28 LAB — TSH: TSH: 1.72 u[IU]/mL (ref 0.35–4.50)

## 2019-06-30 MED ORDER — NORETHINDRONE ACET-ETHINYL EST 1.5-30 MG-MCG PO TABS: 1.0000 | ORAL_TABLET | Freq: Every day | ORAL | 3 refills | Status: DC

## 2019-07-02 ENCOUNTER — Ambulatory Visit (HOSPITAL_COMMUNITY)
Admission: EM | Admit: 2019-07-02 | Discharge: 2019-07-02 | Disposition: A | Discharge status: Home / Self Care | Payer: 59 | Attending: Family Medicine | Admitting: Family Medicine

## 2019-07-02 ENCOUNTER — Other Ambulatory Visit: Payer: Self-pay

## 2019-07-02 ENCOUNTER — Encounter (HOSPITAL_COMMUNITY): Payer: Self-pay | Admitting: Emergency Medicine

## 2019-07-02 DIAGNOSIS — R0981 Nasal congestion: Secondary | ICD-10-CM | POA: Diagnosis present

## 2019-07-02 DIAGNOSIS — Z20822 Contact with and (suspected) exposure to covid-19: Secondary | ICD-10-CM | POA: Insufficient documentation

## 2019-07-02 DIAGNOSIS — J0101 Acute recurrent maxillary sinusitis: Secondary | ICD-10-CM | POA: Insufficient documentation

## 2019-07-02 LAB — SARS CORONAVIRUS 2 (TAT 6-24 HRS): SARS Coronavirus 2: NEGATIVE

## 2019-07-02 MED ORDER — SALINE SPRAY 0.65 % NA SOLN
1.0000 | NASAL | 0 refills | Status: DC | PRN
Start: 2019-07-02 — End: 2020-07-29

## 2019-07-02 MED ORDER — BENZONATATE 100 MG PO CAPS
100.0000 mg | ORAL_CAPSULE | Freq: Three times a day (TID) | ORAL | 0 refills | Status: DC
Start: 2019-07-02 — End: 2020-07-29

## 2019-07-02 MED ORDER — FLUTICASONE PROPIONATE 50 MCG/ACT NA SUSP
1.0000 | Freq: Every day | NASAL | 1 refills | Status: DC
Start: 2019-07-02 — End: 2020-07-29

## 2019-07-02 MED ORDER — AMOXICILLIN-POT CLAVULANATE 875-125 MG PO TABS: 1.0000 | ORAL_TABLET | Freq: Two times a day (BID) | ORAL | 0 refills | Status: AC

## 2019-07-02 NOTE — ED Provider Notes (Signed)
Time    CSN: [id number] Arrival date & time: 07/02/19  1453      History   Chief Complaint Chief Complaint  Patient presents with  . Nasal Congestion  . Cough    HPI Alicia [name] is a 29 y.o. female.   Patient presents for evaluation of sinus congestion, sinus pain and left ear pain.  Reports pain spreads from nose across face to ear.  She also reports cough.  She reports his cough is significantly worse at night and early in the morning.  She reports she feels like something is draining in her throat.  She has also been having a lot of nasal drainage.  She reports a dry throat however denies pain.  She reports she feels like she is losing her voice at times.  She reports baseline headaches and cannot differentiate whether she is having increase in her headaches.  Symptoms have been worsening over the last 2 weeks.  Denies fever or chills.  Denies abdominal pain, nausea or vomiting.  Patient was seen for similar in this urgent care in April.  She was given Augmentin, had  significant provement after a few days of treatment.  She reports taking all the medication.  She reports complete resolution of symptoms in between the time of that episode and her current.     Past Medical History:  Diagnosis Date  . Childhood asthma   . Frequent headaches   . Headache   . Medical history non-contributory   . 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 Persistent cough 05/03/2019  . GAD (generalized anxiety disorder) 03/29/2018  . Panic disorder 08/16/2017    Past Surgical History:  Procedure Laterality Date  . CESAREAN SECTION N/A 05/25/2016   Procedure: CESAREAN SECTION;  Surgeon: Linda Hedges, DO;  Location: [facility];  Service: Obstetrics;  Laterality: N/A;  PRIMARY EDC 05/27/16 NKDA Jill Side, RNFA  . NO PAST SURGERIES      OB History    Gravida  1     Para  1   Term  1   Preterm      AB      Living  1     SAB      TAB      Ectopic      Multiple  0   Live Births  1            Home Medications    Prior to Admission medications   Medication Sig Start Date End Date Taking? Authorizing Provider  Norethindrone Acetate-Ethinyl Estradiol (LOESTRIN) 1.5-30 MG-MCG tablet Take 1 tablet by mouth daily. 06/30/19  Yes Jearld Fenton, NP  topiramate (TOPAMAX) 25 MG tablet Take 1 tablet (25 mg total) by mouth 2 (two) times daily. 06/03/19  Yes Jearld Fenton, NP  amoxicillin-clavulanate (AUGMENTIN) 875-125 MG tablet Take 1 tablet by mouth 2 (two) times daily for 7 days. 07/02/19 07/09/19  Ledora Delker, Marguerita Beards, PA-C  benzonatate (TESSALON) 100 MG capsule Take 1 capsule (100 mg total) by mouth every 8 (eight) hours. 07/02/19   Malikhi Ogan, Marguerita Beards, PA-C  fluticasone (FLONASE) 50 MCG/ACT nasal spray Place 1 spray into both nostrils daily. 07/02/19   Nastassia Bazaldua, Marguerita Beards, PA-C  Multiple Vitamin (MULTIVITAMIN) tablet Take 1 tablet by mouth daily.    [provider]  sodium chloride (OCEAN) 0.65 %  SOLN nasal spray Place 1 spray into both nostrils as needed for congestion. 07/02/19   Alex Leahy, Veryl Speak, PA-C  SUMAtriptan (IMITREX) 25 MG tablet Take 1 tablet (25 mg total) by mouth every 2 (two) hours as needed for migraine. May repeat in 2 hours if headache persists or recurs. 02/28/19   Lorre Munroe, NP  sertraline (ZOLOFT) 50 MG tablet Take 1 tablet (50 mg total) by mouth daily. Patient not taking: Reported on 05/19/2019 03/25/19 05/19/19  Lorre Munroe, NP    Family History Family History  Problem Relation Age of Onset  . Hypertension Mother   . Diabetes Maternal Grandmother   . Hyperlipidemia Maternal Grandmother   . Other Cousin        duplicate 15 Q syndrome  . COPD Father   . Asthma Maternal Grandfather   . COPD Maternal Grandfather   . Hyperlipidemia Maternal Grandfather   . Dementia Paternal Grandmother   . Alzheimer's disease Paternal Grandfather      Social History Social History   Tobacco Use  . Smoking status: Never Smoker  . Smokeless tobacco: Never Used  Substance Use Topics  . Alcohol use: Yes    Comment: rare  . Drug use: No     Allergies   Lexapro [escitalopram oxalate]   Review of Systems Review of Systems   Physical Exam Triage Vital Signs ED Triage Vitals  Enc Vitals Group     BP 07/02/19 1558 110/68     Pulse Rate 07/02/19 1558 60     Resp 07/02/19 1558 16     Temp 07/02/19 1558 99.2 F (37.3 C)     Temp Source 07/02/19 1558 Oral     SpO2 07/02/19 1558 100 %     Weight --      Height --      Head Circumference --      Peak Flow --      Pain Score 07/02/19 1556 4     Pain Loc --      Pain Edu? --      Excl. in GC? --    No data found.  Updated Vital Signs BP 110/68   Pulse 60   Temp 99.2 F (37.3 C) (Oral)   Resp 16   LMP 06/25/2019   SpO2 100%   Visual Acuity Right Eye Distance:   Left Eye Distance:   Bilateral Distance:    Right Eye Near:   Left Eye Near:    Bilateral Near:     Physical Exam Vitals and nursing note reviewed.  Constitutional:      General: She is not in acute distress.    Appearance: She is well-developed. She is ill-appearing.  HENT:     Head: Normocephalic and atraumatic.     Comments: Left-sided maxillary tenderness    Right Ear: Tympanic membrane normal.     Left Ear: Tympanic membrane normal.     Nose: Congestion and rhinorrhea present.     Comments: Significant erythema and swollen turbinates bilaterally.  No polyps appreciable    Mouth/Throat:     Mouth: Mucous membranes are moist.     Comments: Postnasal drip with cobblestoning Eyes:     Extraocular Movements: Extraocular movements intact.     Conjunctiva/sclera: Conjunctivae normal.     Pupils: Pupils are equal, round, and reactive to light.  Cardiovascular:     Rate and Rhythm: Normal rate and regular rhythm.     Heart sounds: No murmur.  Pulmonary:  Effort: Pulmonary effort is  normal. No respiratory distress.     Breath sounds: Normal breath sounds. No wheezing, rhonchi or rales.  Abdominal:     Palpations: Abdomen is soft.     Tenderness: There is no abdominal tenderness.  Musculoskeletal:     Cervical back: Neck supple.  Lymphadenopathy:     Cervical: No cervical adenopathy.  Skin:    General: Skin is warm and dry.  Neurological:     Mental Status: She is alert.      UC Treatments / Results  Labs (all labs ordered are listed, but only abnormal results are displayed) Labs Reviewed  SARS CORONAVIRUS 2 (TAT 6-24 HRS)    EKG   Radiology No results found.  Procedures Procedures (including critical care time)  Medications Ordered in UC Medications - No data to display  Initial Impression / Assessment and Plan / UC Course  I have reviewed the triage vital signs and the nursing notes.  Pertinent labs & imaging results that were available during my care of the patient were reviewed by me and considered in my medical decision making (see chart for details).     #Acute sinusitis Patient is a 29 year old presenting with a recurrent sinusitis.  Patient received Augmentin 05/19/2019 for sinusitis.  Believe this is more of a recurrence versus treatment failure.  Given she has not had antibiotics in the last month will give Augmentin again today.  Recommend Flonase and nasal saline sprays.  Tessalon for cough.  Also discussed that she should be evaluated by ear nose and throat given she has had fairly significant sinus infections back to back, resource given.  Discussed that she may also follow-up with her primary care for discussion of this as well.  We will send a Covid PCR.  Discussed return and fall precautions.  Patient verbalized understanding Final Clinical Impressions(s) / UC Diagnoses   Final diagnoses:  Acute recurrent maxillary sinusitis     Discharge Instructions     Take the Augmentin twice a day for 7 days Use the Flonase daily Use  the sodium chloride nasal spray as much she is like throughout the day Take over-the-counter Tylenol or ibuprofen for pain relief. Take the Tessalon for cough  You should schedule with the ear nose and throat doctors to evaluate for you recurrent sinusitis  Follow-up with your primary care as needed  If your Covid-19 test is positive, you will receive a phone call from [facility] regarding your results. Negative test results are not called. Both positive and negative results area always visible on MyChart. If you do not have a MyChart account, sign up instructions are in your discharge papers.   Persons who are directed to care for themselves at home may discontinue isolation under the following conditions:  . At least 10 days have passed since symptom onset and . At least 24 hours have passed without running a fever (this means without the use of fever-reducing medications) and . Other symptoms have improved.  Persons infected with COVID-19 who never develop symptoms may discontinue isolation and other precautions 10 days after the date of their first positive COVID-19 test.      ED Prescriptions    Medication Sig Dispense Auth. Provider   amoxicillin-clavulanate (AUGMENTIN) 875-125 MG tablet Take 1 tablet by mouth 2 (two) times daily for 7 days. 14 tablet Charlean Carneal, Veryl Speak, PA-C   fluticasone (FLONASE) 50 MCG/ACT nasal spray Place 1 spray into both nostrils daily. 11.1 mL Ady Heimann, Veryl Speak,  PA-C   sodium chloride (OCEAN) 0.65 % SOLN nasal spray Place 1 spray into both nostrils as needed for congestion. 44 mL Myrla Malanowski, Veryl Speak, PA-C   benzonatate (TESSALON) 100 MG capsule Take 1 capsule (100 mg total) by mouth every 8 (eight) hours. 21 capsule Gurshan Settlemire, Veryl Speak, PA-C     PDMP not reviewed this encounter.   Hermelinda Medicus, PA-C 07/02/19 2322

## 2019-07-02 NOTE — ED Triage Notes (Signed)
PT reports cough, congestion for 2 weeks. She has tried several OTC treatments without relief.

## 2019-07-02 NOTE — Discharge Instructions (Addendum)
Take the Augmentin twice a day for 7 days Use the Flonase daily Use the sodium chloride nasal spray as much she is like throughout the day Take over-the-counter Tylenol or ibuprofen for pain relief. Take the Tessalon for cough  You should schedule with the ear nose and throat doctors to evaluate for you recurrent sinusitis  Follow-up with your primary care as needed  If your Covid-19 test is positive, you will receive a phone call from [facility] regarding your results. Negative test results are not called. Both positive and negative results area always visible on MyChart. If you do not have a MyChart account, sign up instructions are in your discharge papers.   Persons who are directed to care for themselves at home may discontinue isolation under the following conditions:   At least 10 days have passed since symptom onset and  At least 24 hours have passed without running a fever (this means without the use of fever-reducing medications) and  Other symptoms have improved.  Persons infected with COVID-19 who never develop symptoms may discontinue isolation and other precautions 10 days after the date of their first positive COVID-19 test.

## 2019-07-05 ENCOUNTER — Other Ambulatory Visit: Payer: Self-pay | Admitting: Internal Medicine

## 2019-07-30 DIAGNOSIS — J0101 Acute recurrent maxillary sinusitis: Secondary | ICD-10-CM | POA: Diagnosis not present

## 2019-07-30 DIAGNOSIS — J329 Chronic sinusitis, unspecified: Secondary | ICD-10-CM | POA: Diagnosis not present

## 2019-07-30 DIAGNOSIS — K219 Gastro-esophageal reflux disease without esophagitis: Secondary | ICD-10-CM | POA: Diagnosis not present

## 2019-09-25 ENCOUNTER — Other Ambulatory Visit: Payer: Self-pay | Admitting: Internal Medicine

## 2019-09-27 NOTE — Telephone Encounter (Signed)
There are 2 different sig for Rx in chart, 1 BID and QHS... please advise which is correct sig

## 2019-09-27 NOTE — Telephone Encounter (Signed)
Bid

## 2019-10-29 ENCOUNTER — Other Ambulatory Visit: Payer: Self-pay | Admitting: Internal Medicine

## 2019-12-02 ENCOUNTER — Other Ambulatory Visit: Payer: Self-pay | Admitting: Internal Medicine

## 2019-12-30 ENCOUNTER — Other Ambulatory Visit: Payer: Self-pay | Admitting: Internal Medicine

## 2020-01-28 ENCOUNTER — Other Ambulatory Visit: Payer: Self-pay | Admitting: Internal Medicine
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
## 2020-03-27 ENCOUNTER — Telehealth: Payer: Self-pay | Admitting: Internal Medicine

## 2020-03-27 NOTE — Telephone Encounter (Signed)
Patient called. Patient needs a refill on Topiramate 25 mg Pharmacy[facility]  30 day supply Patient has 6 pills left.  Patient has been trying to get the medication from her pharmacy, but they told her she had to call it in.  When she called it in, the phone was messing up. Patient has been taking single doses because she was worried about the medication running out and she has been getting migraine headaches.

## 2020-03-31 MED ORDER — TOPIRAMATE 25 MG PO TABS
25.0000 mg | ORAL_TABLET | Freq: Two times a day (BID) | ORAL | 0 refills | Status: DC
Start: 2020-03-31 — End: 2020-09-09

## 2020-04-16 ENCOUNTER — Encounter: Payer: Self-pay | Admitting: Internal Medicine

## 2020-04-17 MED ORDER — SUMATRIPTAN SUCCINATE 25 MG PO TABS: 25.0000 mg | ORAL_TABLET | ORAL | 0 refills | Status: DC | PRN

## 2020-05-25 ENCOUNTER — Other Ambulatory Visit: Payer: Self-pay | Admitting: Internal Medicine

## 2020-05-29 ENCOUNTER — Other Ambulatory Visit: Payer: Self-pay | Admitting: Internal Medicine

## 2020-07-29 ENCOUNTER — Encounter: Payer: Self-pay | Admitting: Emergency Medicine

## 2020-07-29 ENCOUNTER — Ambulatory Visit: Payer: No Typology Code available for payment source | Admitting: Emergency Medicine

## 2020-07-29 ENCOUNTER — Encounter: Payer: Self-pay | Admitting: Neurology

## 2020-07-29 ENCOUNTER — Other Ambulatory Visit: Payer: Self-pay

## 2020-07-29 VITALS — BP 110/62 | HR 75 | Ht 65.0 in | Wt 136.0 lb

## 2020-07-29 DIAGNOSIS — G43809 Other migraine, not intractable, without status migrainosus: Secondary | ICD-10-CM | POA: Diagnosis not present

## 2020-07-29 DIAGNOSIS — Z7689 Persons encountering health services in other specified circumstances: Secondary | ICD-10-CM | POA: Diagnosis not present

## 2020-07-29 MED ORDER — SUMATRIPTAN SUCCINATE 25 MG PO TABS: 25.0000 mg | ORAL_TABLET | ORAL | 0 refills | Status: DC | PRN

## 2020-07-29 NOTE — Progress Notes (Signed)
Alicia [name] 29 y.o.   Chief Complaint  Patient presents with   Transitions Of Care    HISTORY OF PRESENT ILLNESS: This is a 30 y.o. female first visit to this office, here to establish care with me.  PCP moved and is too far for her now. Past medical history includes migraine headaches.  Has been taking Topamax 25 mg twice daily for prophylaxis.  Has never seen a neurologist for evaluation of chronic migraines.  Needs referral. Had episodes of anxiety and panic disorder while separating from husband.  No longer an issue.  Off medications. Sees gynecologist on a regular basis. Non-smoker.  No EtOH user.  Single mother of 98-year-old daughter.  Healthy lifestyle.   HPI   Prior to Admission medications   Medication Sig Start Date End Date Taking? Authorizing Provider  benzonatate (TESSALON) 100 MG capsule Take 1 capsule (100 mg total) by mouth every 8 (eight) hours. 07/02/19   Darr, Gerilyn Pilgrim, PA-C  fluticasone (FLONASE) 50 MCG/ACT nasal spray Place 1 spray into both nostrils daily. 07/02/19   Darr, Gerilyn Pilgrim, PA-C  Multiple Vitamin (MULTIVITAMIN) tablet Take 1 tablet by mouth daily.    [provider]  Norethindrone Acetate-Ethinyl Estradiol (LOESTRIN) 1.5-30 MG-MCG tablet Take 1 tablet by mouth daily. 06/30/19   Lorre Munroe, NP  sodium chloride (OCEAN) 0.65 % SOLN nasal spray Place 1 spray into both nostrils as needed for congestion. 07/02/19   Darr, Gerilyn Pilgrim, PA-C  SUMAtriptan (IMITREX) 25 MG tablet Take 1 tablet (25 mg total) by mouth every 2 (two) hours as needed for migraine. May repeat in 2 hours if headache persists or recurs. 04/17/20   Lorre Munroe, NP  topiramate (TOPAMAX) 25 MG tablet Take 1 tablet (25 mg total) by mouth 2 (two) times daily. MUST SCHEDULE PHYSICAL EXAM 03/31/20   Lorre Munroe, NP  sertraline (ZOLOFT) 50 MG tablet Take 1 tablet (50 mg total) by mouth daily. 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    Past Surgical History:  Procedure Laterality Date   CESAREAN SECTION N/A 05/25/2016   Procedure: CESAREAN SECTION;  Surgeon: Mitchel Honour, DO;  Location: [facility];  Service: Obstetrics;  Laterality: N/A;  PRIMARY EDC 05/27/16 [facility] Odelia Gage, RNFA   NO PAST SURGERIES      Social History   Socioeconomic History   Marital status: Single    Spouse name: Not on file   Number of children: Not on file   Years of education: Not on file   Highest education level: Not on file  Occupational History   Not on file  Tobacco Use   Smoking status: Never   Smokeless tobacco: Never  Vaping Use   Vaping Use: Never used  Substance and Sexual Activity   Alcohol use: Yes    Comment: rare   Drug use: No   Sexual activity: Yes    Birth control/protection:  Pill  Other Topics Concern   Not on file  Social History Narrative   Not on file   Social Determinants of Health   Financial Resource Strain: Not on file  Food Insecurity: Not on file  Transportation Needs: Not on file  Physical Activity: Not on file  Stress: Not on file  Social Connections: Not on file  Intimate Partner Violence: Not on file    Family History  Problem Relation Age of Onset   Hypertension Mother    Diabetes Maternal Grandmother    Hyperlipidemia Maternal Grandmother    Other Cousin        duplicate 15 Q syndrome   COPD Father    Asthma Maternal Grandfather    COPD Maternal Grandfather    Hyperlipidemia Maternal Grandfather    Dementia  Paternal Grandmother    Alzheimer's disease Paternal Grandfather      Review of Systems  Constitutional: Negative.  Negative for chills and fever.  HENT: Negative.  Negative for congestion and sore throat.   Respiratory: Negative.  Negative for cough and shortness of breath.   Cardiovascular: Negative.  Negative for chest pain and palpitations.  Gastrointestinal:  Negative for abdominal pain, nausea and vomiting.  Genitourinary: Negative.  Negative for dysuria and hematuria.  Musculoskeletal: Negative.  Negative for back pain, myalgias and neck pain.  Skin: Negative.  Negative for rash.  Neurological:  Positive for headaches. Negative for dizziness.  All other systems reviewed and are negative.   Physical Exam Vitals reviewed.  Constitutional:      Appearance: Normal appearance.  HENT:     Head: Normocephalic.  Eyes:     Extraocular Movements: Extraocular movements intact.     Pupils: Pupils are equal, round, and reactive to light.  Cardiovascular:     Rate and Rhythm: Normal rate.  Pulmonary:     Effort: Pulmonary effort is normal.  Musculoskeletal:        General: Normal range of motion.     Cervical back: Normal range of motion.  Skin:    General: Skin is warm and dry.  Neurological:     General: No focal deficit present.     Mental Status: She is alert and oriented to person, place, and time.  Psychiatric:        Mood and Affect: Mood normal.        Behavior: Behavior normal.     ASSESSMENT & PLAN: A total of 30 minutes was spent with the patient and counseling/coordination of care regarding preparing for this visit, review of most recent PCPs office visit, review of medications, establishing care with me, review of past medical history including migraine headaches, frequency, and characteristics, need for neurology evaluation, lifestyle choices, diet and nutrition, prognosis, documentation and need for follow-up. Clinically stable.  No medical concerns identified  during this visit. Alicia [name] was seen today for transitions of care.  Diagnoses and all orders for this visit:  Other migraine without status migrainosus, not intractable -     Ambulatory referral to Neurology  Encounter to establish care  Patient Instructions  Migraine Headache A migraine headache is a very strong throbbing pain on one side or both sides of your head. This type of headache can also cause other symptoms. It can last from 4 hours to 3 days. Talk with your doctor about what things may bring on (trigger) this condition. What are the causes? The exact cause of this condition is not known. This condition may be triggered or caused by:  Drinking alcohol. Smoking. Taking medicines, such as: Medicine used to treat chest pain (nitroglycerin). Birth control pills. Estrogen. Some blood pressure medicines. Eating or drinking certain products. Doing physical activity. Other things that may trigger a migraine headache include: Having a menstrual period. Pregnancy. Hunger. Stress. Not getting enough sleep or getting too much sleep. Weather changes. Tiredness (fatigue). What increases the risk? Being 70-3 years old. Being female. Having a family history of migraine headaches. Being Caucasian. Having depression or anxiety. Being very overweight. What are the signs or symptoms? A throbbing pain. This pain may: Happen in any area of the head, such as on one side or both sides. Make it hard to do daily activities. Get worse with physical activity. Get worse around bright lights or loud noises. Other symptoms may include: Feeling sick to your stomach (nauseous). Vomiting. Dizziness. Being sensitive to bright lights, loud noises, or smells. Before you get a migraine headache, you may get warning signs (an aura). An aura may include: Seeing flashing lights or having blind spots. Seeing bright spots, halos, or zigzag lines. Having tunnel vision or blurred vision. Having  numbness or a tingling feeling. Having trouble talking. Having weak muscles. Some people have symptoms after a migraine headache (postdromal phase), such as: Tiredness. Trouble thinking (concentrating). How is this treated? Taking medicines that: Relieve pain. Relieve the feeling of being sick to your stomach. Prevent migraine headaches. Treatment may also include: Having acupuncture. Avoiding foods that bring on migraine headaches. Learning ways to control your body functions (biofeedback). Therapy to help you know and deal with negative thoughts (cognitive behavioral therapy). Follow these instructions at home: Medicines Take over-the-counter and prescription medicines only as told by your doctor. Ask your doctor if the medicine prescribed to you: Requires you to avoid driving or using heavy machinery. Can cause trouble pooping (constipation). You may need to take these steps to prevent or treat trouble pooping: Drink enough fluid to keep your pee (urine) pale yellow. Take over-the-counter or prescription medicines. Eat foods that are high in fiber. These include beans, whole grains, and fresh fruits and vegetables. Limit foods that are high in fat and sugar. These include fried or sweet foods. Lifestyle Do not drink alcohol. Do not use any products that contain nicotine or tobacco, such as cigarettes, e-cigarettes, and chewing tobacco. If you need help quitting, ask your doctor. Get at least 8 hours of sleep every night. Limit and deal with stress. General instructions     Keep a journal to find out what may bring on your migraine headaches. For example, write down: What you eat and drink. How much sleep you get. Any change in what you eat or drink. Any change in your medicines. If you have a migraine headache: Avoid things that make your symptoms worse, such as bright lights. It may help to lie down in a dark, quiet room. Do not drive or use heavy machinery. Ask your  doctor what activities are safe for you. Keep all follow-up visits as told by your doctor. This is important. Contact a doctor if: You get a migraine headache that is different or worse than others you have had. You have more than 15 headache days in one month. Get help right away if: Your migraine headache gets very bad. Your migraine headache lasts longer than 72 hours. You have a fever. You have a stiff neck. You have trouble seeing. Your muscles feel weak or like you cannot control them. You start to lose your balance a lot.  You start to have trouble walking. You pass out (faint). You have a seizure. Summary A migraine headache is a very strong throbbing pain on one side or both sides of your head. These headaches can also cause other symptoms. This condition may be treated with medicines and changes to your lifestyle. Keep a journal to find out what may bring on your migraine headaches. Contact a doctor if you get a migraine headache that is different or worse than others you have had. Contact your doctor if you have more than 15 headache days in a month. This information is not intended to replace advice given to you by your health care provider. Make sure you discuss any questions you have with your healthcare provider. Document Revised: 05/04/2018 Document Reviewed: 02/22/2018 Elsevier Patient Education  2022 [employer].   Edwina BarthMiguel Jashae Wiggs, MD [facility]

## 2020-07-29 NOTE — Patient Instructions (Signed)
Migraine Headache A migraine headache is a very strong throbbing pain on one side or both sides of your head. This type of headache can also cause other symptoms. It can last from 4 hours to 3 days. Talk with your doctor about what things may bring on (trigger) this condition. What are the causes? The exact cause of this condition is not known. This condition may be triggered or caused by: Drinking alcohol. Smoking. Taking medicines, such as: Medicine used to treat chest pain (nitroglycerin). Birth control pills. Estrogen. Some blood pressure medicines. Eating or drinking certain products. Doing physical activity. Other things that may trigger a migraine headache include: Having a menstrual period. Pregnancy. Hunger. Stress. Not getting enough sleep or getting too much sleep. Weather changes. Tiredness (fatigue). What increases the risk? Being 25-55 years old. Being female. Having a family history of migraine headaches. Being Caucasian. Having depression or anxiety. Being very overweight. What are the signs or symptoms? A throbbing pain. This pain may: Happen in any area of the head, such as on one side or both sides. Make it hard to do daily activities. Get worse with physical activity. Get worse around bright lights or loud noises. Other symptoms may include: Feeling sick to your stomach (nauseous). Vomiting. Dizziness. Being sensitive to bright lights, loud noises, or smells. Before you get a migraine headache, you may get warning signs (an aura). An aura may include: Seeing flashing lights or having blind spots. Seeing bright spots, halos, or zigzag lines. Having tunnel vision or blurred vision. Having numbness or a tingling feeling. Having trouble talking. Having weak muscles. Some people have symptoms after a migraine headache (postdromal phase), such as: Tiredness. Trouble thinking (concentrating). How is this treated? Taking medicines that: Relieve  pain. Relieve the feeling of being sick to your stomach. Prevent migraine headaches. Treatment may also include: Having acupuncture. Avoiding foods that bring on migraine headaches. Learning ways to control your body functions (biofeedback). Therapy to help you know and deal with negative thoughts (cognitive behavioral therapy). Follow these instructions at home: Medicines Take over-the-counter and prescription medicines only as told by your doctor. Ask your doctor if the medicine prescribed to you: Requires you to avoid driving or using heavy machinery. Can cause trouble pooping (constipation). You may need to take these steps to prevent or treat trouble pooping: Drink enough fluid to keep your pee (urine) pale yellow. Take over-the-counter or prescription medicines. Eat foods that are high in fiber. These include beans, whole grains, and fresh fruits and vegetables. Limit foods that are high in fat and sugar. These include fried or sweet foods. Lifestyle Do not drink alcohol. Do not use any products that contain nicotine or tobacco, such as cigarettes, e-cigarettes, and chewing tobacco. If you need help quitting, ask your doctor. Get at least 8 hours of sleep every night. Limit and deal with stress. General instructions   Keep a journal to find out what may bring on your migraine headaches. For example, write down: What you eat and drink. How much sleep you get. Any change in what you eat or drink. Any change in your medicines. If you have a migraine headache: Avoid things that make your symptoms worse, such as bright lights. It may help to lie down in a dark, quiet room. Do not drive or use heavy machinery. Ask your doctor what activities are safe for you. Keep all follow-up visits as told by your doctor. This is important. Contact a doctor if: You get a migraine headache that   is different or worse than others you have had. You have more than 15 headache days in one  month. Get help right away if: Your migraine headache gets very bad. Your migraine headache lasts longer than 72 hours. You have a fever. You have a stiff neck. You have trouble seeing. Your muscles feel weak or like you cannot control them. You start to lose your balance a lot. You start to have trouble walking. You pass out (faint). You have a seizure. Summary A migraine headache is a very strong throbbing pain on one side or both sides of your head. These headaches can also cause other symptoms. This condition may be treated with medicines and changes to your lifestyle. Keep a journal to find out what may bring on your migraine headaches. Contact a doctor if you get a migraine headache that is different or worse than others you have had. Contact your doctor if you have more than 15 headache days in a month. This information is not intended to replace advice given to you by your health care provider. Make sure you discuss any questions you have with your health care provider. Document Revised: 05/04/2018 Document Reviewed: 02/22/2018 Elsevier Patient Education  2022 Elsevier Inc.  

## 2020-09-09 ENCOUNTER — Encounter: Payer: Self-pay | Admitting: Emergency Medicine

## 2020-09-09 ENCOUNTER — Other Ambulatory Visit: Payer: Self-pay

## 2020-09-09 ENCOUNTER — Ambulatory Visit (INDEPENDENT_AMBULATORY_CARE_PROVIDER_SITE_OTHER): Payer: No Typology Code available for payment source | Admitting: Emergency Medicine

## 2020-09-09 VITALS — BP 109/60 | HR 96 | Temp 98.2°F | Ht 65.0 in | Wt 140.0 lb

## 2020-09-09 DIAGNOSIS — Z13220 Encounter for screening for lipoid disorders: Secondary | ICD-10-CM | POA: Diagnosis not present

## 2020-09-09 DIAGNOSIS — Z1329 Encounter for screening for other suspected endocrine disorder: Secondary | ICD-10-CM

## 2020-09-09 DIAGNOSIS — Z130 Encounter for screening for diseases of the blood and blood-forming organs and certain disorders involving the immune mechanism: Secondary | ICD-10-CM | POA: Diagnosis not present

## 2020-09-09 DIAGNOSIS — Z0000 Encounter for general adult medical examination without abnormal findings: Secondary | ICD-10-CM | POA: Diagnosis not present

## 2020-09-09 DIAGNOSIS — Z13228 Encounter for screening for other metabolic disorders: Secondary | ICD-10-CM

## 2020-09-09 LAB — LIPID PANEL
Cholesterol: 194 mg/dL (ref 0–200)
HDL: 62.2 mg/dL (ref >39.00)
LDL Cholesterol: 103 mg/dL — ABNORMAL HIGH (ref 0–99)
NonHDL: 131.98
Total CHOL/HDL Ratio: 3
Triglycerides: 145 mg/dL (ref 0.0–149.0)
VLDL: 29 mg/dL (ref 0.0–40.0)

## 2020-09-09 LAB — COMPREHENSIVE METABOLIC PANEL
ALT: 18 U/L (ref 0–35)
AST: 18 U/L (ref 0–37)
Albumin: 4.2 g/dL (ref 3.5–5.2)
Alkaline Phosphatase: 59 U/L (ref 39–117)
BUN: 11 mg/dL (ref 6–23)
CO2: 25 mEq/L (ref 19–32)
Calcium: 9.7 mg/dL (ref 8.4–10.5)
Chloride: 104 mEq/L (ref 96–112)
Creatinine, Ser: 0.91 mg/dL (ref 0.40–1.20)
GFR: 84.33 mL/min (ref >60.00)
Glucose, Bld: 88 mg/dL (ref 70–99)
Potassium: 4.3 mEq/L (ref 3.5–5.1)
Sodium: 138 mEq/L (ref 135–145)
Total Bilirubin: 0.4 mg/dL (ref 0.2–1.2)
Total Protein: 7.4 g/dL (ref 6.0–8.3)

## 2020-09-09 LAB — HEMOGLOBIN A1C: Hgb A1c MFr Bld: 5.2 % (ref 4.6–6.5)

## 2020-09-09 LAB — CBC WITH DIFFERENTIAL/PLATELET
Basophils Absolute: 0.1 10*3/uL (ref 0.0–0.1)
Basophils Relative: 1 % (ref 0.0–3.0)
Eosinophils Absolute: 0.6 10*3/uL (ref 0.0–0.7)
Eosinophils Relative: 10.1 % — ABNORMAL HIGH (ref 0.0–5.0)
HCT: 36.9 % (ref 36.0–46.0)
Hemoglobin: 12.5 g/dL (ref 12.0–15.0)
Lymphocytes Relative: 24.2 % (ref 12.0–46.0)
Lymphs Abs: 1.3 10*3/uL (ref 0.7–4.0)
MCHC: 33.9 g/dL (ref 30.0–36.0)
MCV: 94.1 fl (ref 78.0–100.0)
Monocytes Absolute: 0.3 10*3/uL (ref 0.1–1.0)
Monocytes Relative: 6.1 % (ref 3.0–12.0)
Neutro Abs: 3.2 10*3/uL (ref 1.4–7.7)
Neutrophils Relative %: 58.6 % (ref 43.0–77.0)
Platelets: 293 10*3/uL (ref 150.0–400.0)
RBC: 3.92 Mil/uL (ref 3.87–5.11)
RDW: 13.1 % (ref 11.5–15.5)
WBC: 5.5 10*3/uL (ref 4.0–10.5)

## 2020-09-09 LAB — TSH: TSH: 1.84 u[IU]/mL (ref 0.35–5.50)

## 2020-09-09 NOTE — Progress Notes (Signed)
[name] 30 y.o.   Chief Complaint  Patient presents with   Annual Exam    HISTORY OF PRESENT ILLNESS: This is a 30 y.o. female here for annual exam. Has a history of chronic migraine headaches.  Well-controlled stable. Sees gynecologist on a regular basis.  Schedule for annual and Pap smear next month. Has no complaints or medical concerns today. Has history of right shoulder injury in 2020.  Slowly making progress.  HPI   Prior to Admission medications   Medication Sig Start Date End Date Taking? Authorizing Provider  Biotin 2.5 MG CAPS Take by mouth.   Yes [provider]  Drospirenone-Ethinyl Estradiol-Levomefol (BEYAZ) 3-0.02-0.451 MG tablet Take 1 tablet by mouth daily.   Yes [provider]  SUMAtriptan (IMITREX) 25 MG tablet Take 1 tablet (25 mg total) by mouth every 2 (two) hours as needed for migraine. May repeat in 2 hours if headache persists or recurs. 07/29/20  Yes [place] Agostinelli, Eilleen Kempf, MD  Multiple Vitamin (MULTIVITAMIN) tablet Take 1 tablet by mouth daily. Patient not taking: Reported on 07/29/2020    [provider]  topiramate (TOPAMAX) 25 MG tablet Take 1 tablet (25 mg total) by mouth 2 (two) times daily. MUST SCHEDULE PHYSICAL EXAM 03/31/20   Lorre Munroe, NP  sertraline (ZOLOFT) 50 MG tablet Take 1 tablet (50 mg total) by mouth daily. 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    Past Surgical History:  Procedure Laterality Date    CESAREAN SECTION N/A 05/25/2016   Procedure: CESAREAN SECTION;  Surgeon: Mitchel Honour, DO;  Location: [facility];  Service: Obstetrics;  Laterality: N/A;  PRIMARY EDC 05/27/16 NKDA Heather K, RNFA   NO PAST SURGERIES      Social History   Socioeconomic History   Marital status: Single    Spouse name: Not on file   Number of children: Not on file   Years of education: Not on file   Highest education level: Not on file  Occupational History   Not on file  Tobacco Use   Smoking status: Never   Smokeless tobacco: Never  Vaping Use   Vaping Use: Never used  Substance and Sexual Activity   Alcohol use: Yes    Comment: rare   Drug use: No   Sexual activity: Yes    Birth control/protection: Pill  Other Topics Concern   Not on file  Social History Narrative   Not on file   Social Determinants of Health   Financial Resource Strain: Not on file  Food Insecurity: Not on file  Transportation Needs: Not on file  Physical Activity: Not on file  Stress: Not on file  Social Connections: Not on file  Intimate Partner Violence: Not on file    Family History  Problem Relation Age of Onset   Hypertension Mother    Diabetes Maternal Grandmother    Hyperlipidemia Maternal Grandmother    Other Cousin  duplicate 15 Q syndrome   COPD Father    Asthma Maternal Grandfather    COPD Maternal Grandfather    Hyperlipidemia Maternal Grandfather    Dementia Paternal Grandmother    Alzheimer's disease Paternal Grandfather      Review of Systems  Constitutional: Negative.  Negative for chills and fever.  HENT: Negative.  Negative for congestion and sore throat.   Respiratory: Negative.  Negative for cough and shortness of breath.   Cardiovascular: Negative.  Negative for chest pain and palpitations.  Gastrointestinal: Negative.  Negative for abdominal pain, diarrhea, nausea and vomiting.  Genitourinary: Negative.  Negative for dysuria and hematuria.  Musculoskeletal:   Positive for joint pain (Right shoulder injury residual pain).  Skin: Negative.  Negative for rash.  Neurological:  Positive for headaches (Chronic migraine headaches). Negative for dizziness.  All other systems reviewed and are negative.   Physical Exam Vitals reviewed.  Constitutional:      Appearance: Normal appearance.  HENT:     Head: Normocephalic.     Right Ear: Tympanic membrane, ear canal and external ear normal.     Left Ear: Tympanic membrane, ear canal and external ear normal.  Eyes:     Extraocular Movements: Extraocular movements intact.     Conjunctiva/sclera: Conjunctivae normal.     Pupils: Pupils are equal, round, and reactive to light.  Cardiovascular:     Rate and Rhythm: Normal rate and regular rhythm.     Pulses: Normal pulses.     Heart sounds: Normal heart sounds.  Pulmonary:     Effort: Pulmonary effort is normal.     Breath sounds: Normal breath sounds.  Abdominal:     General: Bowel sounds are normal. There is no distension.     Palpations: Abdomen is soft.     Tenderness: There is no abdominal tenderness.  Musculoskeletal:        General: Normal range of motion.     Cervical back: Normal range of motion and neck supple. No tenderness.  Lymphadenopathy:     Cervical: No cervical adenopathy.  Skin:    General: Skin is warm and dry.     Capillary Refill: Capillary refill takes less than 2 seconds.  Neurological:     General: No focal deficit present.     Mental Status: She is alert and oriented to person, place, and time.  Psychiatric:        Mood and Affect: Mood normal.        Behavior: Behavior normal.     ASSESSMENT & PLAN: Alicia [name] was seen today for annual exam.  Diagnoses and all orders for this visit:  Routine general medical examination at a health care facility  Screening for deficiency anemia -     CBC with Differential  Screening for lipoid disorders -     Lipid panel  Screening for endocrine, metabolic and immunity  disorder -     Comprehensive metabolic panel -     Hemoglobin A1c -     TSH  Modifiable risk factors discussed with patient. Anticipatory guidance according to age provided. The following topics were also discussed: Social Determinants of Health Smoking advised not warranted Diet and nutrition Benefits of exercise Cancer screening need to follow-up with gynecologist next month for Pap smear Vaccinations recommendations Cardiovascular risk assessment Mental health including depression and anxiety Fall and accident prevention  Patient Instructions  Health Maintenance, Female Adopting a healthy lifestyle and getting preventive care are important in promoting health and wellness. Ask  your health care provider about: The right schedule for you to have regular tests and exams. Things you can do on your own to prevent diseases and keep yourself healthy. What should I know about diet, weight, and exercise? Eat a healthy diet  Eat a diet that includes plenty of vegetables, fruits, low-fat dairy products, and lean protein. Do not eat a lot of foods that are high in solid fats, added sugars, or sodium.  Maintain a healthy weight Body mass index (BMI) is used to identify weight problems. It estimates body fat based on height and weight. Your health care provider can help determineyour BMI and help you achieve or maintain a healthy weight. Get regular exercise Get regular exercise. This is one of the most important things you can do for your health. Most adults should: Exercise for at least 150 minutes each week. The exercise should increase your heart rate and make you sweat (moderate-intensity exercise). Do strengthening exercises at least twice a week. This is in addition to the moderate-intensity exercise. Spend less time sitting. Even light physical activity can be beneficial. Watch cholesterol and blood lipids Have your blood tested for lipids and cholesterol at 31 years of age, then  havethis test every 5 years. Have your cholesterol levels checked more often if: Your lipid or cholesterol levels are high. You are older than 31 years of age. You are at high risk for heart disease. What should I know about cancer screening? Depending on your health history and family history, you may need to have cancer screening at various ages. This may include screening for: Breast cancer. Cervical cancer. Colorectal cancer. Skin cancer. Lung cancer. What should I know about heart disease, diabetes, and high blood pressure? Blood pressure and heart disease High blood pressure causes heart disease and increases the risk of stroke. This is more likely to develop in people who have high blood pressure readings, are of African descent, or are overweight. Have your blood pressure checked: Every 3-5 years if you are 34-37 years of age. Every year if you are 49 years old or older. Diabetes Have regular diabetes screenings. This checks your fasting blood sugar level. Have the screening done: Once every three years after age 14 if you are at a normal weight and have a low risk for diabetes. More often and at a younger age if you are overweight or have a high risk for diabetes. What should I know about preventing infection? Hepatitis B If you have a higher risk for hepatitis B, you should be screened for this virus. Talk with your health care provider to find out if you are at risk forhepatitis B infection. Hepatitis C Testing is recommended for: Everyone born from [birth year] through 1966. Anyone with known risk factors for hepatitis C. Sexually transmitted infections (STIs) Get screened for STIs, including gonorrhea and chlamydia, if: You are sexually active and are younger than 31 years of age. You are older than 31 years of age and your health care provider tells you that you are at risk for this type of infection. Your sexual activity has changed since you were last screened, and you are at  increased risk for chlamydia or gonorrhea. Ask your health care provider if you are at risk. Ask your health care provider about whether you are at high risk for HIV. Your health care provider may recommend a prescription medicine to help prevent HIV infection. If you choose to take medicine to prevent HIV, you should first get tested for  HIV. You should then be tested every 3 months for as long as you are taking the medicine. Pregnancy If you are about to stop having your period (premenopausal) and you may become pregnant, seek counseling before you get pregnant. Take 400 to 800 micrograms (mcg) of folic acid every day if you become pregnant. Ask for birth control (contraception) if you want to prevent pregnancy. Osteoporosis and menopause Osteoporosis is a disease in which the bones lose minerals and strength with aging. This can result in bone fractures. If you are 31 years old or older, or if you are at risk for osteoporosis and fractures, ask your health care provider if you should: Be screened for bone loss. Take a calcium or vitamin D supplement to lower your risk of fractures. Be given hormone replacement therapy (HRT) to treat symptoms of menopause. Follow these instructions at home: Lifestyle Do not use any products that contain nicotine or tobacco, such as cigarettes, e-cigarettes, and chewing tobacco. If you need help quitting, ask your health care provider. Do not use street drugs. Do not share needles. Ask your health care provider for help if you need support or information about quitting drugs. Alcohol use Do not drink alcohol if: Your health care provider tells you not to drink. You are pregnant, may be pregnant, or are planning to become pregnant. If you drink alcohol: Limit how much you use to 0-1 drink a day. Limit intake if you are breastfeeding. Be aware of how much alcohol is in your drink. In the [place] one drink equals one 12 oz bottle of beer (355 mL), one 5 oz glass  of wine (148 mL), or one 1 oz glass of hard liquor (44 mL). General instructions Schedule regular health, dental, and eye exams. Stay current with your vaccines. Tell your health care provider if: You often feel depressed. You have ever been abused or do not feel safe at home. Summary Adopting a healthy lifestyle and getting preventive care are important in promoting health and wellness. Follow your health care provider's instructions about healthy diet, exercising, and getting tested or screened for diseases. Follow your health care provider's instructions on monitoring your cholesterol and blood pressure. This information is not intended to replace advice given to you by your health care provider. Make sure you discuss any questions you have with your healthcare provider. Document Revised: 01/03/2018 Document Reviewed: 01/03/2018 Elsevier Patient Education  2022 Elsevier Inc.   Edwina BarthMiguel Annie Saephan, MD [facility]

## 2020-09-09 NOTE — Patient Instructions (Signed)
Health Maintenance, Female Adopting a healthy lifestyle and getting preventive care are important in promoting health and wellness. Ask your health care provider about: The right schedule for you to have regular tests and exams. Things you can do on your own to prevent diseases and keep yourself healthy. What should I know about diet, weight, and exercise? Eat a healthy diet  Eat a diet that includes plenty of vegetables, fruits, low-fat dairy products, and lean protein. Do not eat a lot of foods that are high in solid fats, added sugars, or sodium.  Maintain a healthy weight Body mass index (BMI) is used to identify weight problems. It estimates body fat based on height and weight. Your health care provider can help determineyour BMI and help you achieve or maintain a healthy weight. Get regular exercise Get regular exercise. This is one of the most important things you can do for your health. Most adults should: Exercise for at least 150 minutes each week. The exercise should increase your heart rate and make you sweat (moderate-intensity exercise). Do strengthening exercises at least twice a week. This is in addition to the moderate-intensity exercise. Spend less time sitting. Even light physical activity can be beneficial. Watch cholesterol and blood lipids Have your blood tested for lipids and cholesterol at 31 years of age, then havethis test every 5 years. Have your cholesterol levels checked more often if: Your lipid or cholesterol levels are high. You are older than 31 years of age. You are at high risk for heart disease. What should I know about cancer screening? Depending on your health history and family history, you may need to have cancer screening at various ages. This may include screening for: Breast cancer. Cervical cancer. Colorectal cancer. Skin cancer. Lung cancer. What should I know about heart disease, diabetes, and high blood pressure? Blood pressure and heart  disease High blood pressure causes heart disease and increases the risk of stroke. This is more likely to develop in people who have high blood pressure readings, are of African descent, or are overweight. Have your blood pressure checked: Every 3-5 years if you are 18-39 years of age. Every year if you are 40 years old or older. Diabetes Have regular diabetes screenings. This checks your fasting blood sugar level. Have the screening done: Once every three years after age 40 if you are at a normal weight and have a low risk for diabetes. More often and at a younger age if you are overweight or have a high risk for diabetes. What should I know about preventing infection? Hepatitis B If you have a higher risk for hepatitis B, you should be screened for this virus. Talk with your health care provider to find out if you are at risk forhepatitis B infection. Hepatitis C Testing is recommended for: Everyone born from 1945 through 1965. Anyone with known risk factors for hepatitis C. Sexually transmitted infections (STIs) Get screened for STIs, including gonorrhea and chlamydia, if: You are sexually active and are younger than 31 years of age. You are older than 31 years of age and your health care provider tells you that you are at risk for this type of infection. Your sexual activity has changed since you were last screened, and you are at increased risk for chlamydia or gonorrhea. Ask your health care provider if you are at risk. Ask your health care provider about whether you are at high risk for HIV. Your health care provider may recommend a prescription medicine to help   prevent HIV infection. If you choose to take medicine to prevent HIV, you should first get tested for HIV. You should then be tested every 3 months for as long as you are taking the medicine. Pregnancy If you are about to stop having your period (premenopausal) and you may become pregnant, seek counseling before you get  pregnant. Take 400 to 800 micrograms (mcg) of folic acid every day if you become pregnant. Ask for birth control (contraception) if you want to prevent pregnancy. Osteoporosis and menopause Osteoporosis is a disease in which the bones lose minerals and strength with aging. This can result in bone fractures. If you are 65 years old or older, or if you are at risk for osteoporosis and fractures, ask your health care provider if you should: Be screened for bone loss. Take a calcium or vitamin D supplement to lower your risk of fractures. Be given hormone replacement therapy (HRT) to treat symptoms of menopause. Follow these instructions at home: Lifestyle Do not use any products that contain nicotine or tobacco, such as cigarettes, e-cigarettes, and chewing tobacco. If you need help quitting, ask your health care provider. Do not use street drugs. Do not share needles. Ask your health care provider for help if you need support or information about quitting drugs. Alcohol use Do not drink alcohol if: Your health care provider tells you not to drink. You are pregnant, may be pregnant, or are planning to become pregnant. If you drink alcohol: Limit how much you use to 0-1 drink a day. Limit intake if you are breastfeeding. Be aware of how much alcohol is in your drink. In the [place] one drink equals one 12 oz bottle of beer (355 mL), one 5 oz glass of wine (148 mL), or one 1 oz glass of hard liquor (44 mL). General instructions Schedule regular health, dental, and eye exams. Stay current with your vaccines. Tell your health care provider if: You often feel depressed. You have ever been abused or do not feel safe at home. Summary Adopting a healthy lifestyle and getting preventive care are important in promoting health and wellness. Follow your health care provider's instructions about healthy diet, exercising, and getting tested or screened for diseases. Follow your health care provider's  instructions on monitoring your cholesterol and blood pressure. This information is not intended to replace advice given to you by your health care provider. Make sure you discuss any questions you have with your healthcare provider. Document Revised: 01/03/2018 Document Reviewed: 01/03/2018 Elsevier Patient Education  2022 [employer] Inc.  

## 2020-10-15 NOTE — Progress Notes (Signed)
NEUROLOGY CONSULTATION NOTE  Alicia [name] MRN: [id number] DOB: 01/11/1991  Referring provider: Georgina Quint, MD Primary care provider: Georgina Quint, MD  Reason for consult:  migraines  Assessment/Plan:   Migraine with aura, without status migrainosus, not intractable  Migraine prevention:  Plan is to start a CGRP inhibitor.  She will contact her insurance to find out which are formulary and let us know (Aimovig, Emgality, Ajovy) - I did tell her that topiramate is still an option as doses she was taking are less likely to affect birth control medication.  However, given that she has aura, I advised that birth control medication (at least estrogen-containing) may be contraindicated and to discuss with her gynecologist.   Migraine rescue:  advised to use sumatriptan 25mg  as first line. Limit use of pain relievers to no more than 2 days out of week to prevent risk of rebound or medication-overuse headache. Keep headache diary Follow up 6 months    Subjective:  Alicia [name] is a 30 year old female with mood disorder who presents for migraines.  History supplemented by referring provider's notes.  In highschool, she started having headaches described severe right-sided pounding/throbbing pain with nausea, photophobia, phonophobia, preceded by tunnel vision/spots in vision, and when severe she sees change in colors. Prodrome with difficulty focusing.  They last 24 to 36 hours. Topiramate was helpful.  On topiramate, they occurred once a month.  Topiramate was discontinued after her pharmacist informed her that it may lower efficacy of her birth control medication.  They occur 3-4 times a week since taken off of topiramate Triggers:  emotional stress, menses.  Relieving factors:  heat to back of neck and cold compress on forehead.  Rescue protocol:  Excedrin Migraine or Tylenol, sumatriptan second line Current NSAIDS/analgesics:  Excedrin Migraine, Tylenol Current  triptans:  sumatriptan 25mg  Current ergotamine:  none Current anti-emetic:  none Current muscle relaxants:  none Current Antihypertensive medications:  none Current Antidepressant medications:  none Current Anticonvulsant medications:  none Current anti-CGRP:  none Current Vitamins/Herbal/Supplements:  none Current Antihistamines/Decongestants:  none Other therapy:  none Hormone/birth control:  Drospirenone-Ethinyl Estradiol-Levomefol   Past NSAIDS/analgesics:  none Past abortive triptans:  none Past abortive ergotamine:  none Past muscle relaxants:  none Past anti-emetic:  Zofran, promethazine Past antihypertensive medications:  none Past antidepressant medications:  sertraline, escitalopram Past anticonvulsant medications:  topiramate 25mg  BID Past anti-CGRP:  none Past vitamins/Herbal/Supplements:  none Past antihistamines/decongestants:  Flonase Other past therapies:  none   Depression/Anxiety:  yes.  History of panic attacks. Family history of headache:  paternal father, grandmother   CBC and CMP from August reviewed.   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     Drospirenone-Ethinyl Estradiol-Levomefol (BEYAZ) 3-0.02-0.451 MG tablet Take 1 tablet by mouth daily.  SUMAtriptan (IMITREX) 25 MG tablet Take 1 tablet (25 mg  total) by mouth every 2 (two) hours as needed for migraine. May repeat in 2 hours if headache persists or recurs. 10 tablet 0   [DISCONTINUED] sertraline (ZOLOFT) 50 MG tablet Take 1 tablet (50 mg total) by mouth daily. (Patient not taking: Reported on 05/19/2019) 90 tablet 1   No current facility-administered medications on file prior to visit.    ALLERGIES: Allergies  Allergen Reactions   Lexapro [Escitalopram Oxalate] Diarrhea and Nausea Only    Headache    FAMILY HISTORY: Family History  Problem Relation Age of Onset   Hypertension Mother    Diabetes Maternal Grandmother    Hyperlipidemia Maternal Grandmother    Other Cousin        duplicate 15 Q syndrome   COPD Father    Asthma Maternal Grandfather    COPD Maternal Grandfather    Hyperlipidemia Maternal Grandfather    Dementia Paternal Grandmother    Alzheimer's disease Paternal Grandfather     Objective:  Blood pressure 118/77, pulse 74, height 5\' 5"  (1.651 m), weight 142 lb 12.8 oz (64.8 kg), SpO2 99 %, unknown if currently breastfeeding. General: No acute distress.  Patient appears well-groomed.   Head:  Normocephalic/atraumatic Eyes:  fundi examined but not visualized Neck: supple, no paraspinal tenderness, full range of motion Back: No paraspinal tenderness Heart: regular rate and rhythm Lungs: Clear to auscultation bilaterally. Vascular: No carotid bruits. Neurological Exam: Mental status: alert and oriented to person, place, and time, recent and remote memory intact, fund of knowledge intact, attention and concentration intact, speech fluent and not dysarthric, language intact. Cranial nerves: CN I: not tested CN II: pupils equal, round and reactive to light, visual fields intact CN III, IV, VI:  full range of motion, no nystagmus, no ptosis CN V: facial sensation intact. CN VII: upper and lower face symmetric CN VIII: hearing intact CN IX, X: gag intact, uvula midline CN XI: sternocleidomastoid and  trapezius muscles intact CN XII: tongue midline Bulk & Tone: normal, no fasciculations. Motor:  muscle strength 5/5 throughout Sensation:  Pinprick, temperature and vibratory sensation intact. Deep Tendon Reflexes:  2+ throughout,  toes downgoing.   Finger to nose testing:  Without dysmetria.   Heel to shin:  Without dysmetria.   Gait:  Normal station and stride.  Romberg negative.    Thank you for allowing me to take part in the care of this patient.  , DO  CC: Shon Millet, MD

## 2020-10-16 ENCOUNTER — Ambulatory Visit: Payer: No Typology Code available for payment source | Admitting: Neurology

## 2020-10-16 ENCOUNTER — Telehealth: Payer: Self-pay | Admitting: Neurology

## 2020-10-16 ENCOUNTER — Other Ambulatory Visit: Payer: Self-pay

## 2020-10-16 ENCOUNTER — Encounter: Payer: Self-pay | Admitting: Neurology

## 2020-10-16 VITALS — BP 118/77 | HR 74 | Ht 65.0 in | Wt 142.8 lb

## 2020-10-16 DIAGNOSIS — G43109 Migraine with aura, not intractable, without status migrainosus: Secondary | ICD-10-CM

## 2020-10-16 MED ORDER — SUMATRIPTAN SUCCINATE 25 MG PO TABS: 25.0000 mg | ORAL_TABLET | ORAL | 5 refills | Status: DC | PRN

## 2020-10-16 MED ORDER — AJOVY 225 MG/1.5ML ~~LOC~~ SOAJ: 225.0000 mg | SUBCUTANEOUS | 5 refills | Status: DC

## 2020-10-16 NOTE — Patient Instructions (Signed)
  Contact your insurance to find out which of following migraine prevention injections are covered:  Aimovig, Emgality, Ajovy - contact us and we will send script Take sumatriptan 25mg  at earliest onset of headache.  May repeat dose once in 2 hours if needed.  Maximum 2 tablets in 24 hours.  STOP OVER THE COUNTER ANALGESICS Limit use of pain relievers to no more than 2 days out of the week.  These medications include acetaminophen, NSAIDs (ibuprofen/Advil/Motrin, naproxen/Aleve, triptans (Imitrex/sumatriptan), Excedrin, and narcotics.  This will help reduce risk of rebound headaches. Be aware of common food triggers:  - Caffeine:  coffee, black tea, cola, Mt. Dew  - Chocolate  - Dairy:  aged cheeses (brie, blue, cheddar, gouda, [place], provolone, [place], Swiss, etc), chocolate milk, buttermilk, sour cream, limit eggs and yogurt  - Nuts, peanut butter  - Alcohol  - Cereals/grains:  FRESH breads (fresh bagels, sourdough, doughnuts), yeast productions  - Processed/canned/aged/cured meats (pre-packaged deli meats, hotdogs)  - MSG/glutamate:  soy sauce, flavor enhancer, pickled/preserved/marinated foods  - Sweeteners:  aspartame (Equal, Nutrasweet).  Sugar and Splenda are okay  - Vegetables:  legumes (lima beans, lentils, snow peas, fava beans, pinto peans, peas, garbanzo beans), sauerkraut, onions, olives, pickles  - Fruit:  avocados, bananas, citrus fruit (orange, lemon, grapefruit), mango  - Other:  Frozen meals, macaroni and cheese Routine exercise Stay adequately hydrated (aim for 64 oz water daily) Keep headache diary Maintain proper stress management Maintain proper sleep hygiene Do not skip meals Consider supplements:  magnesium citrate 400mg  daily, riboflavin 400mg  daily, coenzyme Q10 100mg  three times daily. Contact your gynecologist about whether you should remain on your birth control as it may be contraindicated in migraine with aura

## 2020-10-16 NOTE — Telephone Encounter (Signed)
Per Dr.Jaffe, OK to send Ajovy every 28 days.  Pt advised.

## 2020-10-16 NOTE — Telephone Encounter (Signed)
Codes given to patient through My chart patient aware.

## 2020-10-16 NOTE — Telephone Encounter (Signed)
Pt called in, said both emgality and ajovy is covered by her insurance

## 2020-10-16 NOTE — Telephone Encounter (Signed)
Pt called in stating she tried to call her insurance with the list of medications to see if they will cover them and she was told she needed CPT codes for them.

## 2020-10-16 NOTE — Telephone Encounter (Signed)
Pt left a VM needing the CPT code so that she could give to the insurance company to see If they will cover the injection

## 2020-10-18 ENCOUNTER — Encounter: Payer: Self-pay | Admitting: Neurology

## 2020-10-19 NOTE — Telephone Encounter (Signed)
Patient called and said her Ajovy needs prior authorization.

## 2020-10-20 NOTE — Telephone Encounter (Signed)
New message   Alicia [name] (Key: [id number])Need help? Call us at [phone] Outcome N/A Your PA request cannot be processed electronically. Please contact the number from the back of the members prescription card for further assistance Next Steps The plan will fax you a determination, typically within 1 to 5 business days.  How do I follow up? Drug AJOVY (fremanezumab-vfrm) injection 225MG /1.5ML auto-injectors Form [employer] General Prescription Drug Coverage Determination Form Prior Authorization for Medicare Members [phone] [phone]

## 2020-10-21 ENCOUNTER — Telehealth: Payer: Self-pay

## 2020-10-21 NOTE — Telephone Encounter (Signed)
New message   Celica Sheriff (Key: [id number])Need help? Call us at [phone] Outcome N/A Your PA request cannot be processed electronically. Please contact the number from the back of the members prescription card for further assistance Next Steps The plan will fax you a determination, typically within 1 to 5 business days.  How do I follow up? Drug AJOVY (fremanezumab-vfrm) injection 225MG/1.5ML auto-injectors Form [employer] General Prescription Drug Coverage Determination Form Prior Authorization for Medicare Members [phone] [phone] 

## 2020-10-22 NOTE — Telephone Encounter (Signed)
F/u   The patient called in with [employer] prior authorization direct phone number  [phone]  Call and spoke with the representative, aware that prior authorization submits via CoverMyMeds on 10/21/20.   Prior authorization was initiated over the phone with an Musician.    Receive approval for the medication Ajovy 225 mg for a 90 days supply from 10/22/20 to 01/21/21  for 90 day supply.  The patient was contacted via cell phone at  [phone] and made aware of approval.

## 2020-10-23 ENCOUNTER — Telehealth: Payer: Self-pay

## 2020-10-23 MED ORDER — AJOVY 225 MG/1.5ML ~~LOC~~ SOAJ: 225.0000 mg | SUBCUTANEOUS | 2 refills | Status: DC

## 2020-10-23 NOTE — Telephone Encounter (Signed)
Spoke to Pharmacy, Pt will be okay to pick up 3 months supply as script is written 4.5 ml with 2 refills.

## 2020-10-23 NOTE — Telephone Encounter (Signed)
Per pt PA script for Ajovy needs to be written for 90 day supply 4.5 ml.  Script changed.  Mychart message sent to the patient to advise of change.

## 2020-10-29 ENCOUNTER — Other Ambulatory Visit: Payer: Self-pay

## 2020-10-29 ENCOUNTER — Encounter (HOSPITAL_COMMUNITY): Payer: Self-pay | Admitting: Emergency Medicine

## 2020-10-29 ENCOUNTER — Telehealth: Payer: Self-pay | Admitting: Neurology

## 2020-10-29 ENCOUNTER — Ambulatory Visit (HOSPITAL_COMMUNITY)
Admission: EM | Admit: 2020-10-29 | Discharge: 2020-10-29 | Disposition: A | Discharge status: Home / Self Care | Payer: No Typology Code available for payment source | Attending: Emergency Medicine | Admitting: Emergency Medicine

## 2020-10-29 DIAGNOSIS — T7840XA Allergy, unspecified, initial encounter: Secondary | ICD-10-CM | POA: Diagnosis not present

## 2020-10-29 MED ORDER — PREDNISONE 20 MG PO TABS: 40.0000 mg | ORAL_TABLET | Freq: Every day | ORAL | 0 refills | Status: AC

## 2020-10-29 MED ORDER — FAMOTIDINE 20 MG PO TABS: 20.0000 mg | ORAL_TABLET | Freq: Every day | ORAL | 0 refills | Status: DC

## 2020-10-29 NOTE — Telephone Encounter (Signed)
Telephone call to pt, Pt states she injected the Ajovy on Saturday 10/24/20. No other medication taken at that time. No Injection or pain at the injection site.  Or shortness or breathe Just a little soreness of her tongue,  Sunday morning, pt c/o Continued soreness of her tongue, blister or sores on her tongue, Horsiness and sore throat. Monday- no changes Tuesday she woke up to her tongue swollen still no fever or soreness at the injection site.   Ask pt has seen her pcp or Urgent care since the symptoms started. Pt stated she wanted to see what Dr.Jaffe recommends.   Per DR.Jaffe, Please contact your PCP or go to the Urgent care so make sure there is nothing else concerning or going on.

## 2020-10-29 NOTE — Discharge Instructions (Signed)
Take the prednisone daily for the next 5 days. Take the famotidine daily for the next 5 days. Continue to take Zyrtec daily.  You can take Benadryl as needed. Drink plenty of fluids, especially water, and rest.  Follow-up with your primary care provider for reevaluation. If you develop any worsening swelling, throat closing, shortness of breath, or difficulty breathing please call 911 or go to the emergency room for further evaluation.

## 2020-10-29 NOTE — Telephone Encounter (Signed)
Patient called and said she thinks she is having an allergic reaction to her Ajovy.  She said it started Monday but has continued to worsen with her tongue swelling and it's hypersensitive and now has sore throat and hoarseness. (No trouble breathing, per patient.)

## 2020-10-29 NOTE — ED Triage Notes (Signed)
Pt started Ajovy injections on 10/1. On 10/2 she noticed sores on her tongue. On 10/4 her tongue started to swell. She continues to have sores on her tongue that are getting worse. Pt denies any difficulty breathing.

## 2020-10-29 NOTE — Telephone Encounter (Signed)
Pt advised, When symptoms subside, she should contact us for alternative medication options.  Per pt she was given Prednisone and Benadryl to take for the next five days.

## 2020-10-29 NOTE — ED Provider Notes (Signed)
[facility]    CSN: [id number] Arrival date & time: 10/29/20  1029      History   Chief Complaint Chief Complaint  Patient presents with   Sore    HPI Alicia [name] is a 30 y.o. female.   Patient here for evaluation of tongue sores and swelling that have been getting progressively worse since Sunday.  Reports taking her first dose of Ajovy on Saturday.  Reports symptoms have gotten progressively worse over the past several days.  Denies any shortness of breath or difficulty breathing.  Did have an episode where she felt like her throat was closing but states this could have been a panic attack.  Has not taken any OTC medications or treatments.  Denies any specific alleviating or aggravating factors.  Denies any fevers, chest pain, shortness of breath, N/V/D, numbness, tingling, weakness, abdominal pain, or headaches.    The history is provided by the patient.   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, hx anger issues did not have treatment    Patient Active Problem List   Diagnosis Date Noted   GAD (generalized anxiety disorder) 03/29/2018   Panic disorder 08/16/2017    Past Surgical History:  Procedure Laterality Date   CESAREAN SECTION N/A 05/25/2016   Procedure: CESAREAN SECTION;  Surgeon: Mitchel Honour, DO;  Location: [facility];  Service: Obstetrics;  Laterality: N/A;  PRIMARY EDC 05/27/16 NKDA Heather K, RNFA   NO PAST SURGERIES      OB History     Gravida  1   Para  1   Term  1   Preterm      AB      Living  1      SAB      IAB      Ectopic      Multiple  0   Live Births  1            Home Medications    Prior to Admission medications   Medication Sig Start Date End Date Taking? Authorizing Provider  Biotin 2.5 MG CAPS Take by mouth.   Yes [provider]   Drospirenone-Ethinyl Estradiol-Levomefol (BEYAZ) 3-0.02-0.451 MG tablet Take 1 tablet by mouth daily.   Yes [provider]  famotidine (PEPCID) 20 MG tablet Take 1 tablet (20 mg total) by mouth daily for 5 days. 10/29/20 11/03/20 Yes Ivette Loyal, NP  predniSONE (DELTASONE) 20 MG tablet Take 2 tablets (40 mg total) by mouth daily for 5 days. 10/29/20 11/03/20 Yes Ivette Loyal, NP  SUMAtriptan (IMITREX) 25 MG tablet Take 1 tablet (25 mg total) by mouth every 2 (two) hours as needed for migraine (Maximum 2 tablets in 24 hours.). May repeat in 2 hours if headache persists or recurs. 10/16/20  Yes Jaffe, Adam R, DO  Fremanezumab-vfrm (AJOVY) 225 MG/1.5ML SOAJ Inject 225 mg into the skin every 28 (twenty-eight) days. 10/23/20   Drema Dallas, DO  sertraline (ZOLOFT) 50 MG tablet Take 1 tablet (50 mg total) by mouth daily. Patient not taking: Reported on 05/19/2019 03/25/19 05/19/19  Lorre Munroe, NP    Family History Family History  Problem Relation Age of Onset   Hypertension Mother    Migraines Father    COPD Father    Migraines Sister  Migraines Brother    Stroke Maternal Grandmother    Diabetes Maternal Grandmother    Hyperlipidemia Maternal Grandmother    Asthma Maternal Grandfather    COPD Maternal Grandfather    Hyperlipidemia Maternal Grandfather    Migraines Paternal Grandmother    Dementia Paternal Grandmother    Alzheimer's disease Paternal Grandfather    Other Cousin        duplicate 15 Q syndrome    Social History Social History   Tobacco Use   Smoking status: Never   Smokeless tobacco: Never  Vaping Use   Vaping Use: Never used  Substance Use Topics   Alcohol use: Yes    Comment: rare   Drug use: No     Allergies   Lexapro [escitalopram oxalate] and Ajovy [fremanezumab-vfrm]   Review of Systems Review of Systems  HENT:  Positive for mouth sores. Negative for trouble swallowing.   Respiratory:  Negative for shortness of breath.   All other  systems reviewed and are negative.   Physical Exam Triage Vital Signs ED Triage Vitals  Enc Vitals Group     BP 10/29/20 1041 114/73     Pulse Rate 10/29/20 1041 77     Resp --      Temp 10/29/20 1041 97.9 F (36.6 C)     Temp Source 10/29/20 1041 Oral     SpO2 10/29/20 1041 98 %     Weight --      Height --      Head Circumference --      Peak Flow --      Pain Score 10/29/20 1039 2     Pain Loc --      Pain Edu? --      Excl. in GC? --    No data found.  Updated Vital Signs BP 114/73 (BP Location: Left Arm)   Pulse 77   Temp 97.9 F (36.6 C) (Oral)   LMP 10/27/2020   SpO2 98%   Visual Acuity Right Eye Distance:   Left Eye Distance:   Bilateral Distance:    Right Eye Near:   Left Eye Near:    Bilateral Near:     Physical Exam Vitals and nursing note reviewed.  Constitutional:      General: She is not in acute distress.    Appearance: Normal appearance. She is not ill-appearing, toxic-appearing or diaphoretic.  HENT:     Head: Normocephalic and atraumatic.     Mouth/Throat:     Pharynx: Oropharynx is clear. Uvula midline. No pharyngeal swelling or posterior oropharyngeal erythema.     Tonsils: No tonsillar exudate or tonsillar abscesses.     Comments: Patient does have some tongue swelling but no lip or throat swelling Eyes:     Conjunctiva/sclera: Conjunctivae normal.  Cardiovascular:     Rate and Rhythm: Normal rate.     Pulses: Normal pulses.     Heart sounds: Normal heart sounds.  Pulmonary:     Effort: Pulmonary effort is normal.     Breath sounds: Normal breath sounds.  Abdominal:     General: Abdomen is flat.  Musculoskeletal:        General: Normal range of motion.     Cervical back: Normal range of motion.  Skin:    General: Skin is warm and dry.  Neurological:     General: No focal deficit present.     Mental Status: She is alert and oriented to person, place, and time.  Psychiatric:  Mood and Affect: Mood normal.     UC  Treatments / Results  Labs (all labs ordered are listed, but only abnormal results are displayed) Labs Reviewed - No data to display  EKG   Radiology No results found.  Procedures Procedures (including critical care time)  Medications Ordered in UC Medications - No data to display  Initial Impression / Assessment and Plan / UC Course  I have reviewed the triage vital signs and the nursing notes.  Pertinent labs & imaging results that were available during my care of the patient were reviewed by me and considered in my medical decision making (see chart for details).    Assessment negative for red flags or concerns.  Allergic reaction but anaphylaxis unlikely.  Patient instructed not to take medication again.  We will treat with prednisone daily for 5 days, famotidine daily for 5 days and Zyrtec daily.  May take Benadryl as needed.  Encourage fluids and rest.  Follow-up with primary care for reevaluation.  Strict ED follow-up for any worsening symptoms. Final Clinical Impressions(s) / UC Diagnoses   Final diagnoses:  Allergic reaction, initial encounter     Discharge Instructions      Take the prednisone daily for the next 5 days. Take the famotidine daily for the next 5 days. Continue to take Zyrtec daily.  You can take Benadryl as needed. Drink plenty of fluids, especially water, and rest.  Follow-up with your primary care provider for reevaluation. If you develop any worsening swelling, throat closing, shortness of breath, or difficulty breathing please call 911 or go to the emergency room for further evaluation.     ED Prescriptions     Medication Sig Dispense Auth. Provider   predniSONE (DELTASONE) 20 MG tablet Take 2 tablets (40 mg total) by mouth daily for 5 days. 10 tablet Ivette Loyal, NP   famotidine (PEPCID) 20 MG tablet Take 1 tablet (20 mg total) by mouth daily for 5 days. 5 tablet Ivette Loyal, NP      PDMP not reviewed this encounter.    Ivette Loyal, NP 10/29/20 1123

## 2020-11-23 ENCOUNTER — Other Ambulatory Visit: Payer: Self-pay

## 2020-11-23 MED ORDER — TOPIRAMATE 50 MG PO TABS: 50.0000 mg | ORAL_TABLET | Freq: Every day | ORAL | 5 refills | Status: DC

## 2020-11-23 NOTE — Progress Notes (Signed)
Per Dr. Everlena Cooper, restart topiramate (as discussed, this dose does not typically affect birth control medication) - I usually prescribe it once a day at bedtime, so please send prescription for topiramate 50mg  at bedtime.

## 2020-11-25 ENCOUNTER — Ambulatory Visit (INDEPENDENT_AMBULATORY_CARE_PROVIDER_SITE_OTHER): Payer: No Typology Code available for payment source | Admitting: Emergency Medicine

## 2020-11-25 ENCOUNTER — Other Ambulatory Visit: Payer: Self-pay

## 2020-11-25 ENCOUNTER — Encounter: Payer: Self-pay | Admitting: Emergency Medicine

## 2020-11-25 VITALS — BP 118/68 | HR 73 | Ht 65.0 in | Wt 143.0 lb

## 2020-11-25 DIAGNOSIS — H6691 Otitis media, unspecified, right ear: Secondary | ICD-10-CM | POA: Diagnosis not present

## 2020-11-25 DIAGNOSIS — Z23 Encounter for immunization: Secondary | ICD-10-CM | POA: Diagnosis not present

## 2020-11-25 DIAGNOSIS — H9201 Otalgia, right ear: Secondary | ICD-10-CM

## 2020-11-25 MED ORDER — AMOXICILLIN-POT CLAVULANATE 875-125 MG PO TABS: 1.0000 | ORAL_TABLET | Freq: Two times a day (BID) | ORAL | 0 refills | Status: AC

## 2020-11-25 MED ORDER — HYDROCORTISONE-ACETIC ACID 1-2 % OT SOLN: 3.0000 [drp] | Freq: Three times a day (TID) | OTIC | 1 refills | Status: DC

## 2020-11-25 NOTE — Patient Instructions (Signed)
Otitis Media, Adult Otitis media is a condition in which the middle ear is red and swollen (inflamed) and full of fluid. The middle ear is the part of the ear that contains bones for hearing as well as air that helps send sounds to the brain. The condition usually goes away on its own. What are the causes? This condition is caused by a blockage in the eustachian tube. This tube connects the middle ear to the back of the nose. It normally allows air into the middle ear. The blockage is caused by fluid or swelling. Problems that can cause blockage include: A cold or infection that affects the nose, mouth, or throat. Allergies. An irritant, such as tobacco smoke. Adenoids that have become large. The adenoids are soft tissue located in the back of the throat, behind the nose and the roof of the mouth. Growth or swelling in the upper part of the throat, just behind the nose (nasopharynx). Damage to the ear caused by a change in pressure. This is called barotrauma. What increases the risk? You are more likely to develop this condition if you: Smoke or are exposed to tobacco smoke. Have an opening in the roof of your mouth (cleft palate). Have acid reflux. Have problems in your body's defense system (immune system). What are the signs or symptoms? Symptoms of this condition include: Ear pain. Fever. Problems with hearing. Being tired. Fluid leaking from the ear. Ringing in the ear. How is this treated? This condition can go away on its own within 3-5 days. But if the condition is caused by germs (bacteria) and does not go away on its own, or if it keeps coming back, your doctor may: Give you antibiotic medicines. Give you medicines for pain. Follow these instructions at home: Take over-the-counter and prescription medicines only as told by your doctor. If you were prescribed an antibiotic medicine, take it as told by your doctor. Do not stop taking it even if you start to feel better. Keep  all follow-up visits. Contact a doctor if: You have bleeding from your nose. There is a lump on your neck. You are not feeling better in 5 days. You feel worse instead of better. Get help right away if: You have pain that is not helped with medicine. You have swelling, redness, or pain around your ear. You get a stiff neck. You cannot move part of your face (paralysis). You notice that the bone behind your ear hurts when you touch it. You get a very bad headache. Summary Otitis media means that the middle ear is red, swollen, and full of fluid. This condition usually goes away on its own. If the problem does not go away, treatment may be needed. You may be given medicines to treat the infection or to treat your pain. If you were prescribed an antibiotic medicine, take it as told by your doctor. Do not stop taking it even if you start to feel better. Keep all follow-up visits. This information is not intended to replace advice given to you by your health care provider. Make sure you discuss any questions you have with your health care provider. Document Revised: 04/20/2020 Document Reviewed: 04/20/2020 Elsevier Patient Education  2022 Elsevier Inc.  

## 2020-11-25 NOTE — Progress Notes (Signed)
Alicia [name] 30 y.o.   Chief Complaint  Patient presents with   Ear Pain    Pt states that her R ear is ringing and she can't hear well our of that ear. X1 day    HISTORY OF PRESENT ILLNESS: Acute problem visit as follows: This is a 31 y.o. female complaining of discomfort and congestion to her right ear that started today. No other significant associated symptoms.  HPI   Prior to Admission medications   Medication Sig Start Date End Date Taking? Authorizing Provider  Biotin 2.5 MG CAPS Take by mouth.    [provider]  Drospirenone-Ethinyl Estradiol-Levomefol (BEYAZ) 3-0.02-0.451 MG tablet Take 1 tablet by mouth daily.    [provider]  famotidine (PEPCID) 20 MG tablet Take 1 tablet (20 mg total) by mouth daily for 5 days. 10/29/20 11/03/20  Ivette Loyal, NP  Fremanezumab-vfrm (AJOVY) 225 MG/1.5ML SOAJ Inject 225 mg into the skin every 28 (twenty-eight) days. 10/23/20   Drema Dallas, DO  SUMAtriptan (IMITREX) 25 MG tablet Take 1 tablet (25 mg total) by mouth every 2 (two) hours as needed for migraine (Maximum 2 tablets in 24 hours.). May repeat in 2 hours if headache persists or recurs. 10/16/20   Drema Dallas, DO  topiramate (TOPAMAX) 50 MG tablet Take 1 tablet (50 mg total) by mouth daily. 11/23/20   Drema Dallas, DO  sertraline (ZOLOFT) 50 MG tablet Take 1 tablet (50 mg total) by mouth daily. 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    Past Surgical History:  Procedure Laterality Date   CESAREAN SECTION N/A 05/25/2016   Procedure: CESAREAN SECTION;  Surgeon: Mitchel Honour, DO;  Location: [facility];  Service: Obstetrics;  Laterality: N/A;  PRIMARY EDC 05/27/16 NKDA Heather K, RNFA   NO PAST SURGERIES      Social History   Socioeconomic History   Marital status: Single    Spouse name: Not on file   Number of children: Not on file   Years of education: Not on file   Highest education level: Not on file  Occupational History   Not on file  Tobacco Use   Smoking status: Never   Smokeless tobacco: Never  Vaping Use   Vaping Use: Never used  Substance and Sexual Activity   Alcohol use: Yes    Comment: rare   Drug use: No   Sexual activity: Yes    Birth control/protection: Pill  Other Topics Concern   Not on file  Social History Narrative   Not on file   Social Determinants of Health   Financial Resource Strain: Not on file  Food Insecurity: Not on file  Transportation Needs: Not on file  Physical Activity: Not on file  Stress: Not on file  Social Connections: Not on file  Intimate Partner Violence:  Not on file    Family History  Problem Relation Age of Onset   Hypertension Mother    Migraines Father    COPD Father    Migraines Sister    Migraines Brother    Stroke Maternal Grandmother    Diabetes Maternal Grandmother    Hyperlipidemia Maternal Grandmother    Asthma Maternal Grandfather    COPD Maternal Grandfather    Hyperlipidemia Maternal Grandfather    Migraines Paternal Grandmother    Dementia Paternal Grandmother    Alzheimer's disease Paternal Grandfather    Other Cousin        duplicate 15 Q syndrome     Review of Systems  Constitutional: Negative.  Negative for chills and fever.  HENT:  Positive for congestion and ear pain. Negative for sore throat.   Respiratory: Negative.  Negative for cough and shortness of  breath.   Cardiovascular: Negative.  Negative for chest pain and palpitations.  Gastrointestinal:  Negative for abdominal pain, diarrhea, nausea and vomiting.  Genitourinary: Negative.   Skin: Negative.  Negative for rash.  Neurological:  Negative for dizziness and headaches.  All other systems reviewed and are negative.   Physical Exam Vitals reviewed.  Constitutional:      Appearance: Normal appearance.  HENT:     Head: Normocephalic.     Left Ear: Tympanic membrane, ear canal and external ear normal.     Ears:     Comments: Hyperemic right external canal and hyperemic and bulging right tympanic membrane    Mouth/Throat:     Mouth: Mucous membranes are moist.     Pharynx: Oropharynx is clear.  Eyes:     Extraocular Movements: Extraocular movements intact.     Pupils: Pupils are equal, round, and reactive to light.  Cardiovascular:     Rate and Rhythm: Normal rate.  Pulmonary:     Effort: Pulmonary effort is normal.  Musculoskeletal:     Cervical back: No tenderness.  Lymphadenopathy:     Cervical: No cervical adenopathy.  Skin:    General: Skin is warm and dry.     Capillary Refill: Capillary refill takes less than 2 seconds.  Neurological:     General: No focal deficit present.     Mental Status: She is alert and oriented to person, place, and time.  Psychiatric:        Mood and Affect: Mood normal.        Behavior: Behavior normal.     ASSESSMENT & PLAN: Problem List Items Addressed This Visit   None Visit Diagnoses     Acute otalgia, right    -  Primary   Relevant Medications   acetic acid-hydrocortisone (VOSOL-HC) OTIC solution   Right otitis media, unspecified otitis media type       Relevant Medications   amoxicillin-clavulanate (AUGMENTIN) 875-125 MG tablet   Need for influenza vaccination       Relevant Orders   Flu Vaccine QUAD 73mo+IM (Fluarix, Fluzone & Alfiuria Quad PF) (Completed)      Patient Instructions  Otitis Media, Adult Otitis media  is a condition in which the middle ear is red and swollen (inflamed) and full of fluid. The middle ear is the part of the ear that contains bones for hearing as well as air that helps send sounds to the brain. The condition usually goes away on its own. What are the causes? This condition is caused by a blockage in the eustachian tube. This tube connects the middle ear to  the back of the nose. It normally allows air into the middle ear. The blockage is caused by fluid or swelling. Problems that can cause blockage include: A cold or infection that affects the nose, mouth, or throat. Allergies. An irritant, such as tobacco smoke. Adenoids that have become large. The adenoids are soft tissue located in the back of the throat, behind the nose and the roof of the mouth. Growth or swelling in the upper part of the throat, just behind the nose (nasopharynx). Damage to the ear caused by a change in pressure. This is called barotrauma. What increases the risk? You are more likely to develop this condition if you: Smoke or are exposed to tobacco smoke. Have an opening in the roof of your mouth (cleft palate). Have acid reflux. Have problems in your body's defense system (immune system). What are the signs or symptoms? Symptoms of this condition include: Ear pain. Fever. Problems with hearing. Being tired. Fluid leaking from the ear. Ringing in the ear. How is this treated? This condition can go away on its own within 3-5 days. But if the condition is caused by germs (bacteria) and does not go away on its own, or if it keeps coming back, your doctor may: Give you antibiotic medicines. Give you medicines for pain. Follow these instructions at home: Take over-the-counter and prescription medicines only as told by your doctor. If you were prescribed an antibiotic medicine, take it as told by your doctor. Do not stop taking it even if you start to feel better. Keep all follow-up visits. Contact a  doctor if: You have bleeding from your nose. There is a lump on your neck. You are not feeling better in 5 days. You feel worse instead of better. Get help right away if: You have pain that is not helped with medicine. You have swelling, redness, or pain around your ear. You get a stiff neck. You cannot move part of your face (paralysis). You notice that the bone behind your ear hurts when you touch it. You get a very bad headache. Summary Otitis media means that the middle ear is red, swollen, and full of fluid. This condition usually goes away on its own. If the problem does not go away, treatment may be needed. You may be given medicines to treat the infection or to treat your pain. If you were prescribed an antibiotic medicine, take it as told by your doctor. Do not stop taking it even if you start to feel better. Keep all follow-up visits. This information is not intended to replace advice given to you by your health care provider. Make sure you discuss any questions you have with your health care provider. Document Revised: 04/20/2020 Document Reviewed: 04/20/2020 Elsevier Patient Education  2022 Elsevier Inc.    Edwina Barth, MD [facility]

## 2020-12-01 ENCOUNTER — Encounter: Payer: Self-pay | Admitting: Emergency Medicine

## 2021-04-13 ENCOUNTER — Encounter: Payer: Self-pay | Admitting: Emergency Medicine

## 2021-04-13 ENCOUNTER — Ambulatory Visit (INDEPENDENT_AMBULATORY_CARE_PROVIDER_SITE_OTHER): Payer: No Typology Code available for payment source | Admitting: Emergency Medicine

## 2021-04-13 ENCOUNTER — Other Ambulatory Visit: Payer: Self-pay

## 2021-04-13 ENCOUNTER — Ambulatory Visit (INDEPENDENT_AMBULATORY_CARE_PROVIDER_SITE_OTHER): Payer: No Typology Code available for payment source

## 2021-04-13 VITALS — BP 108/70 | HR 89 | Temp 98.2°F | Ht 65.0 in | Wt 144.1 lb

## 2021-04-13 DIAGNOSIS — R0989 Other specified symptoms and signs involving the circulatory and respiratory systems: Secondary | ICD-10-CM | POA: Diagnosis not present

## 2021-04-13 DIAGNOSIS — R053 Chronic cough: Secondary | ICD-10-CM

## 2021-04-13 DIAGNOSIS — R0602 Shortness of breath: Secondary | ICD-10-CM | POA: Diagnosis not present

## 2021-04-13 DIAGNOSIS — R059 Cough, unspecified: Secondary | ICD-10-CM | POA: Diagnosis not present

## 2021-04-13 DIAGNOSIS — J22 Unspecified acute lower respiratory infection: Secondary | ICD-10-CM | POA: Diagnosis not present

## 2021-04-13 MED ORDER — PREDNISONE 20 MG PO TABS: 20.0000 mg | ORAL_TABLET | Freq: Every day | ORAL | 0 refills | Status: AC

## 2021-04-13 MED ORDER — BENZONATATE 200 MG PO CAPS: 200.0000 mg | ORAL_CAPSULE | Freq: Two times a day (BID) | ORAL | 0 refills | Status: DC | PRN

## 2021-04-13 MED ORDER — ALBUTEROL SULFATE HFA 108 (90 BASE) MCG/ACT IN AERS: 2.0000 | INHALATION_SPRAY | Freq: Four times a day (QID) | RESPIRATORY_TRACT | 3 refills | Status: DC | PRN

## 2021-04-13 MED ORDER — AZITHROMYCIN 250 MG PO TABS: ORAL_TABLET | ORAL | 0 refills | Status: DC

## 2021-04-13 MED ORDER — ALBUTEROL SULFATE HFA 108 (90 BASE) MCG/ACT IN AERS: 2.0000 | INHALATION_SPRAY | Freq: Two times a day (BID) | RESPIRATORY_TRACT | 3 refills | Status: DC

## 2021-04-13 NOTE — Progress Notes (Signed)
Alicia [name] 30 y.o.   Chief Complaint  Patient presents with   Cough    X 6 weeks, discomfort in chest from coughing x 4 weeks., OTC chest/nasal decongestants, allergy meds not working     HISTORY OF PRESENT ILLNESS: Acute problem visit today. This is a 31 y.o. female complaining of persisting cough that started about 6 weeks ago along with chest congestion. Progressively getting worse.  Denies fever or chills.  Intermittent wheezing but no other associated symptoms.  Cough Associated symptoms include wheezing. Pertinent negatives include no chest pain, chills, fever, headaches, hemoptysis, rash, sore throat or shortness of breath.    Prior to Admission medications   Medication Sig Start Date End Date Taking? Authorizing Provider  Biotin 2.5 MG CAPS Take by mouth.   Yes [provider]  SUMAtriptan (IMITREX) 25 MG tablet Take 1 tablet (25 mg total) by mouth every 2 (two) hours as needed for migraine (Maximum 2 tablets in 24 hours.). May repeat in 2 hours if headache persists or recurs. 10/16/20  Yes Jaffe, Adam R, DO  topiramate (TOPAMAX) 50 MG tablet Take 1 tablet (50 mg total) by mouth daily. 11/23/20  Yes Jaffe, Adam R, DO  acetic acid-hydrocortisone (VOSOL-HC) OTIC solution Place 3 drops into the left ear 3 (three) times daily. Patient not taking: Reported on 04/13/2021 11/25/20   Georgina Quint, MD  sertraline (ZOLOFT) 50 MG tablet Take 1 tablet (50 mg total) by mouth daily. 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    Past Surgical History:  Procedure Laterality Date   CESAREAN SECTION N/A 05/25/2016   Procedure: CESAREAN SECTION;  Surgeon: Mitchel Honour, DO;  Location: [facility];  Service: Obstetrics;  Laterality: N/A;  PRIMARY EDC 05/27/16 NKDA Heather K, RNFA   NO PAST SURGERIES      Social History   Socioeconomic History   Marital status: Single    Spouse name: Not on file   Number of children: Not on file   Years of education: Not on file   Highest education level: Not on file  Occupational History   Not on file  Tobacco Use   Smoking status: Never   Smokeless tobacco: Never  Vaping Use   Vaping Use: Never used  Substance and Sexual Activity   Alcohol use: Yes    Comment: rare   Drug use: No   Sexual activity: Yes    Birth control/protection: Pill  Other Topics Concern   Not on file  Social History Narrative   Not on file   Social Determinants of Health   Financial Resource Strain: Not on file  Food Insecurity: Not on file  Transportation Needs: Not on file  Physical Activity: Not on file  Stress: Not on file  Social Connections: Not on file  Intimate Partner Violence: Not on file  Family History  Problem Relation Age of Onset   Hypertension Mother    Migraines Father    COPD Father    Migraines Sister    Migraines Brother    Stroke Maternal Grandmother    Diabetes Maternal Grandmother    Hyperlipidemia Maternal Grandmother    Asthma Maternal Grandfather    COPD Maternal Grandfather    Hyperlipidemia Maternal Grandfather    Migraines Paternal Grandmother    Dementia Paternal Grandmother    Alzheimer's disease Paternal Grandfather    Other Cousin        duplicate 15 Q syndrome     Review of Systems  Constitutional: Negative.  Negative for chills and fever.  HENT:  Positive for congestion. Negative for sore  throat.   Respiratory:  Positive for cough, sputum production and wheezing. Negative for hemoptysis and shortness of breath.   Cardiovascular:  Negative for chest pain and palpitations.  Gastrointestinal: Negative.  Negative for abdominal pain, diarrhea, nausea and vomiting.  Genitourinary: Negative.   Musculoskeletal: Negative.   Skin: Negative.  Negative for rash.  Neurological: Negative.  Negative for dizziness and headaches.  All other systems reviewed and are negative.  Today's Vitals   04/13/21 1458  BP: 108/70  Pulse: 89  Temp: 98.2 F (36.8 C)  TempSrc: Oral  SpO2: 99%  Weight: 144 lb 2 oz (65.4 kg)  Height: 5\' 5"  (1.651 m)   Body mass index is 23.98 kg/m.  Physical Exam Vitals reviewed.  Constitutional:      Appearance: Normal appearance.  HENT:     Head: Normocephalic.     Mouth/Throat:     Mouth: Mucous membranes are moist.     Pharynx: Oropharynx is clear.  Eyes:     Extraocular Movements: Extraocular movements intact.     Conjunctiva/sclera: Conjunctivae normal.     Pupils: Pupils are equal, round, and reactive to light.  Cardiovascular:     Rate and Rhythm: Normal rate and regular rhythm.     Pulses: Normal pulses.     Heart sounds: Normal heart sounds.  Pulmonary:     Effort: Pulmonary effort is normal.     Breath sounds: Normal breath sounds.  Musculoskeletal:     Cervical back: No tenderness.     Right lower leg: No edema.     Left lower leg: No edema.  Lymphadenopathy:     Cervical: No cervical adenopathy.  Skin:    General: Skin is warm and dry.     Capillary Refill: Capillary refill takes less than 2 seconds.  Neurological:     General: No focal deficit present.     Mental Status: She is alert and oriented to person, place, and time.  Psychiatric:        Mood and Affect: Mood normal.        Behavior: Behavior normal.    DG Chest 2 View  Result Date: 04/13/2021 CLINICAL DATA:  Persistent cough, shortness of breath and congestion x6  weeks. EXAM: CHEST - 2 VIEW COMPARISON:  May 19, 2019 FINDINGS: The heart size and mediastinal contours are within normal limits. No focal consolidation. No pleural effusion. No pneumothorax. The visualized skeletal structures are unremarkable. IMPRESSION: No acute cardiopulmonary disease. Electronically Signed   By: Maudry Mayhew M.D.   On: 04/13/2021 15:44    ASSESSMENT & PLAN: Clinically stable.  No red flag signs or symptoms. Initial viral respiratory infection now with secondary bacterial infection.  May benefit from antibiotic. We will start bronchodilator and  anti-inflammatory along with cough suppressant. Advised to rest and stay well-hydrated. Advised to contact the office if no better or worse during the next several days.  Problem List Items Addressed This Visit   None Visit Diagnoses     Persistent cough    -  Primary   Relevant Medications   benzonatate (TESSALON) 200 MG capsule   Other Relevant Orders   DG Chest 2 View (Completed)   Lower respiratory infection       Relevant Medications   azithromycin (ZITHROMAX) 250 MG tablet   Other Relevant Orders   DG Chest 2 View (Completed)   Chest congestion       Relevant Medications   predniSONE (DELTASONE) 20 MG tablet   albuterol (VENTOLIN HFA) 108 (90 Base) MCG/ACT inhaler      Patient Instructions  Cough, Adult A cough helps to clear your throat and lungs. A cough may be a sign of an illness or another medical condition. An acute cough may only last 2-3 weeks, while a chronic cough may last 8 or more weeks. Many things can cause a cough. They include: Germs (viruses or bacteria) that attack the airway. Breathing in things that bother (irritate) your lungs. Allergies. Asthma. Mucus that runs down the back of your throat (postnasal drip). Smoking. Acid backing up from the stomach into the tube that moves food from the mouth to the stomach (gastroesophageal reflux). Some medicines. Lung problems. Other medical  conditions, such as heart failure or a blood clot in the lung (pulmonary embolism). Follow these instructions at home: Medicines Take over-the-counter and prescription medicines only as told by your doctor. Talk with your doctor before you take medicines that stop a cough (cough suppressants). Lifestyle  Do not smoke, and try not to be around smoke. Do not use any products that contain nicotine or tobacco, such as cigarettes, e-cigarettes, and chewing tobacco. If you need help quitting, ask your doctor. Drink enough fluid to keep your pee (urine) pale yellow. Avoid caffeine. Do not drink alcohol if your doctor tells you not to drink. General instructions  Watch for any changes in your cough. Tell your doctor about them. Always cover your mouth when you cough. Stay away from things that make you cough, such as perfume, candles, campfire smoke, or cleaning products. If the air is dry, use a cool mist vaporizer or humidifier in your home. If your cough is worse at night, try using extra pillows to raise your head up higher while you sleep. Rest as needed. Keep all follow-up visits as told by your doctor. This is important. Contact a doctor if: You have new symptoms. You cough up pus. Your cough does not get better after 2-3 weeks, or your cough gets worse. Cough medicine does not help your cough and you are not sleeping well. You have pain that gets worse or pain that is not helped with medicine. You have a fever. You are losing weight and you do not know why. You have night sweats. Get help right away if: You cough up blood. You have trouble breathing. Your heartbeat is very fast. These symptoms may be an emergency. Do not wait to see if the symptoms will go away. Get medical help right away. Call your local emergency services (911 in the [place] Do not drive yourself to the hospital. Summary A cough helps to clear your throat and lungs. Many things can cause a cough. Take  over-the-counter and prescription medicines only as told by your doctor. Always  cover your mouth when you cough. Contact a doctor if you have new symptoms or you have a cough that does not get better or gets worse. This information is not intended to replace advice given to you by your health care provider. Make sure you discuss any questions you have with your health care provider. Document Revised: 03/01/2019 Document Reviewed: 01/29/2018 Elsevier Patient Education  2022 Elsevier Inc.     Edwina Barth, MD [facility]

## 2021-04-13 NOTE — Patient Instructions (Signed)

## 2021-04-26 ENCOUNTER — Ambulatory Visit: Payer: No Typology Code available for payment source | Admitting: Neurology

## 2021-04-26 NOTE — Progress Notes (Signed)
? ?NEUROLOGY FOLLOW UP OFFICE NOTE ? ?Alicia [name] ?[id number] ? ?Assessment/Plan:  ? ?Migraine with aura, without status migrainosus, not intractable ?  ?Migraine prevention:  Topiramate 50mg  at bedtime ?Migraine rescue:  sumatriptan 25mg  as first line. ?Limit use of pain relievers to no more than 2 days out of week to prevent risk of rebound or medication-overuse headache. ?Keep headache diary ?Follow up 9 months ?  ?  ?  ?Subjective:  ?Alicia [name] is a 31 year old female with mood disorder who follows up for migraines. ? ?UPDATE: ?Started Ajovy but 3 days after injection, she developed blisters and swelling of her tongue.    Restarted topiramate.  No severe migraine ?Intensity:  moderate ?Duration:  usually 2 hours.   ?Frequency:  1 to 2 times every 3 months ?Frequency of abortive medication: 1 to times every 3 months.  ?Only auras occasionally.    ?Rescue protocol:  sumatriptan ?Current NSAIDS/analgesics:  none ?Current triptans:  sumatriptan 25mg  ?Current ergotamine:  none ?Current anti-emetic:  none ?Current muscle relaxants:  none ?Current Antihypertensive medications:  none ?Current Antidepressant medications:  none ?Current Anticonvulsant medications:  topiramate 50mg  QHS ?Current anti-CGRP:  none ?Current Vitamins/Herbal/Supplements:  none ?Current Antihistamines/Decongestants:  none ?Other therapy:  none ?Hormone/birth control:  none ? ?Caffeine:  1 cup black tea every morning.   ?Diet:  Rarely soda.  Eating healthier.   ?Exercise:  good ?Depression/Anxiety:  yes.  History of panic attacks. ?Other pain:  none ?Sleep hygiene:  varies.   ? ?HISTORY:  ?In highschool, she started having headaches described severe right-sided pounding/throbbing pain with nausea, photophobia, phonophobia, preceded by tunnel vision/spots in vision, and when severe she sees change in colors. Prodrome with difficulty focusing.  They last 24 to 36 hours. Topiramate was helpful.  On topiramate, they occurred once a month.   Topiramate was discontinued after her pharmacist informed her that it may lower efficacy of her birth control medication.  They occur 3-4 times a week since taken off of topiramate.   Triggers:  emotional stress, menses.  Relieving factors:  heat to back of neck and cold compress on forehead. ? ?  ?  ?Past NSAIDS/analgesics:  Excedrin Migraine, Tylenol ?Past abortive triptans:  none ?Past abortive ergotamine:  none ?Past muscle relaxants:  none ?Past anti-emetic:  Zofran, promethazine ?Past antihypertensive medications:  none ?Past antidepressant medications:  sertraline, escitalopram ?Past anticonvulsant medications:  topiramate 25mg  BID ?Past anti-CGRP:  Ajovy (tongue swelling/blisters) ?Past vitamins/Herbal/Supplements:  none ?Past antihistamines/decongestants:  Flonase ?Other past therapies:  none ?  ?  ? ?Family history of headache:  paternal grandmother, father, sister, brother ? ?PAST MEDICAL HISTORY: ?Past Medical History:  ?Diagnosis Date  ? Childhood asthma   ? Frequent headaches   ? Headache   ? Medical history non-contributory   ? Mental disorder   ? 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 ? ?MEDICATIONS: ?Current Outpatient Medications on File Prior to Visit  ?Medication Sig Dispense Refill  ? acetic acid-hydrocortisone (VOSOL-HC) OTIC solution Place 3 drops into the left ear 3 (three) times daily. (Patient not taking: Reported on 04/13/2021) 10 mL 1  ? albuterol (VENTOLIN HFA) 108 (90 Base) MCG/ACT inhaler Inhale 2 puffs into the lungs 2 (two) times daily for 5 days. 1 each 3  ? azithromycin (ZITHROMAX) 250 MG tablet Sig as indicated 6 tablet 0  ? benzonatate (TESSALON) 200 MG capsule Take 1 capsule (200 mg total) by mouth 2 (two) times daily  as needed for cough. 20 capsule 0  ? Biotin 2.5 MG CAPS Take by mouth.    ? SUMAtriptan (IMITREX) 25 MG tablet Take 1 tablet (25 mg total) by mouth every 2 (two) hours as needed for migraine (Maximum 2 tablets in 24 hours.). May repeat  in 2 hours if headache persists or recurs. 10 tablet 5  ? topiramate (TOPAMAX) 50 MG tablet Take 1 tablet (50 mg total) by mouth daily. 30 tablet 5  ? [DISCONTINUED] sertraline (ZOLOFT) 50 MG tablet Take 1 tablet (50 mg total) by mouth daily. (Patient not taking: Reported on 05/19/2019) 90 tablet 1  ? ?No current facility-administered medications on file prior to visit.  ? ? ?ALLERGIES: ?Allergies  ?Allergen Reactions  ? Lexapro [Escitalopram Oxalate] Diarrhea and Nausea Only  ?  Headache  ? Ajovy [Fremanezumab-Vfrm] Swelling and Other (See Comments)  ?  Sores in mouth  ? ? ?FAMILY HISTORY: ?Family History  ?Problem Relation Age of Onset  ? Hypertension Mother   ? Migraines Father   ? COPD Father   ? Migraines Sister   ? Migraines Brother   ? Stroke Maternal Grandmother   ? Diabetes Maternal Grandmother   ? Hyperlipidemia Maternal Grandmother   ? Asthma Maternal Grandfather   ? COPD Maternal Grandfather   ? Hyperlipidemia Maternal Grandfather   ? Migraines Paternal Grandmother   ? Dementia Paternal Grandmother   ? Alzheimer's disease Paternal Grandfather   ? Other Cousin   ?     duplicate 15 Q syndrome  ? ? ?  ?Objective:  ?Blood pressure 100/69, pulse 63, resp. rate 18, height 5\' 5"  (1.651 m), weight 142 lb (64.4 kg), SpO2 97 %. ?General: No acute distress.  Patient appears well-groomed.   ? ? ? ? , DO ? ?CC: Shon Millet, MD ? ? ? ? ? ? ?

## 2021-04-27 ENCOUNTER — Encounter: Payer: Self-pay | Admitting: Neurology

## 2021-04-27 ENCOUNTER — Ambulatory Visit (INDEPENDENT_AMBULATORY_CARE_PROVIDER_SITE_OTHER): Payer: 59 | Admitting: Neurology

## 2021-04-27 VITALS — BP 100/69 | HR 63 | Resp 18 | Ht 65.0 in | Wt 142.0 lb

## 2021-04-27 DIAGNOSIS — G43109 Migraine with aura, not intractable, without status migrainosus: Secondary | ICD-10-CM | POA: Diagnosis not present

## 2021-04-27 NOTE — Patient Instructions (Signed)
Continue topiramate 50mg  at bedtime ?Sumatriptan as directed.  Limit use of pain relievers to no more than 2 days out of week to prevent risk of rebound or medication-overuse headache. ?Keep headache diary ?Follow up in 9 months. ?

## 2021-05-17 ENCOUNTER — Telehealth: Payer: Self-pay | Admitting: Neurology

## 2021-05-17 MED ORDER — TOPIRAMATE 50 MG PO TABS: 50.0000 mg | ORAL_TABLET | Freq: Every day | ORAL | 5 refills | Status: DC

## 2021-05-17 MED ORDER — SUMATRIPTAN SUCCINATE 25 MG PO TABS: 25.0000 mg | ORAL_TABLET | ORAL | 5 refills | Status: DC | PRN

## 2021-05-17 NOTE — Telephone Encounter (Signed)
Patient has a new pharmacy ?[facility] [address] ?

## 2021-05-17 NOTE — Telephone Encounter (Signed)
Pharmacy changed

## 2021-05-20 ENCOUNTER — Other Ambulatory Visit: Payer: Self-pay | Admitting: Nurse Practitioner

## 2021-05-20 DIAGNOSIS — N6452 Nipple discharge: Secondary | ICD-10-CM

## 2021-05-31 ENCOUNTER — Ambulatory Visit: Payer: 59

## 2021-05-31 ENCOUNTER — Ambulatory Visit
Admission: RE | Admit: 2021-05-31 | Discharge: 2021-05-31 | Disposition: A | Discharge status: Home / Self Care | Payer: 59 | Source: Ambulatory Visit | Attending: Nurse Practitioner | Admitting: Nurse Practitioner

## 2021-05-31 DIAGNOSIS — N6452 Nipple discharge: Secondary | ICD-10-CM

## 2021-06-04 ENCOUNTER — Telehealth: Payer: 59 | Admitting: Physician Assistant

## 2021-06-04 DIAGNOSIS — H66002 Acute suppurative otitis media without spontaneous rupture of ear drum, left ear: Secondary | ICD-10-CM | POA: Diagnosis not present

## 2021-06-04 MED ORDER — AMOXICILLIN-POT CLAVULANATE 875-125 MG PO TABS: 1.0000 | ORAL_TABLET | Freq: Two times a day (BID) | ORAL | 0 refills | Status: DC

## 2021-06-04 MED ORDER — NEOMYCIN-POLYMYXIN-HC 3.5-10000-1 OT SOLN: 3.0000 [drp] | Freq: Four times a day (QID) | OTIC | 0 refills | Status: DC

## 2021-06-04 NOTE — Patient Instructions (Signed)
?Alicia [name], thank you for joining Alicia Loveless, PA-C for today's virtual visit.  While this provider is not your primary care provider (PCP), if your PCP is located in our provider database this encounter information will be shared with them immediately following your visit. ? ?Consent: ?(Patient) Alicia [name] provided verbal consent for this virtual visit at the beginning of the encounter. ? ?Current Medications: ? ?Current Outpatient Medications:  ?  amoxicillin-clavulanate (AUGMENTIN) 875-125 MG tablet, Take 1 tablet by mouth 2 (two) times daily., Disp: 14 tablet, Rfl: 0 ?  neomycin-polymyxin-hydrocortisone (CORTISPORIN) OTIC solution, Place 3 drops into the left ear 4 (four) times daily. X 7 days, Disp: 10 mL, Rfl: 0 ?  albuterol (VENTOLIN HFA) 108 (90 Base) MCG/ACT inhaler, Inhale 2 puffs into the lungs 2 (two) times daily for 5 days., Disp: 1 each, Rfl: 3 ?  benzonatate (TESSALON) 200 MG capsule, Take 1 capsule (200 mg total) by mouth 2 (two) times daily as needed for cough., Disp: 20 capsule, Rfl: 0 ?  Biotin 2.5 MG CAPS, Take by mouth., Disp: , Rfl:  ?  fexofenadine (ALLEGRA) 60 MG tablet, Take 60 mg by mouth 2 (two) times daily., Disp: , Rfl:  ?  SUMAtriptan (IMITREX) 25 MG tablet, Take 1 tablet (25 mg total) by mouth every 2 (two) hours as needed for migraine (Maximum 2 tablets in 24 hours.). May repeat in 2 hours if headache persists or recurs., Disp: 10 tablet, Rfl: 5 ?  topiramate (TOPAMAX) 50 MG tablet, Take 1 tablet (50 mg total) by mouth daily., Disp: 30 tablet, Rfl: 5  ? ?Medications ordered in this encounter:  ?Meds ordered this encounter  ?Medications  ? amoxicillin-clavulanate (AUGMENTIN) 875-125 MG tablet  ?  Sig: Take 1 tablet by mouth 2 (two) times daily.  ?  Dispense:  14 tablet  ?  Refill:  0  ?  Order Specific Question:   Supervising Provider  ?  Answer:   Eber Hong [[id number]]  ? neomycin-polymyxin-hydrocortisone (CORTISPORIN) OTIC solution  ?  Sig: Place 3 drops into the  left ear 4 (four) times daily. X 7 days  ?  Dispense:  10 mL  ?  Refill:  0  ?  Order Specific Question:   Supervising Provider  ?  Answer:   Eber Hong [[id number]]  ?  ? ?*If you need refills on other medications prior to your next appointment, please contact your pharmacy* ? ?Follow-Up: ?Call back or seek an in-person evaluation if the symptoms worsen or if the condition fails to improve as anticipated. ? ?Other Instructions ?Otitis Media, Adult ? ?Otitis media occurs when there is inflammation and fluid in the middle ear with signs and symptoms of an acute infection. The middle ear is a part of the ear that contains bones for hearing as well as air that helps send sounds to the brain. When infected fluid builds up in this space, it causes pressure and can lead to an ear infection. The eustachian tube connects the middle ear to the back of the nose (nasopharynx) and normally allows air into the middle ear. If the eustachian tube becomes blocked, fluid can build up and become infected. ?What are the causes? ?This condition is caused by a blockage in the eustachian tube. This can be caused by mucus or by swelling of the tube. Problems that can cause a blockage include: ?A cold or other upper respiratory infection. ?Allergies. ?An irritant, such as tobacco smoke. ?Enlarged adenoids. The adenoids are areas of  soft tissue located high in the back of the throat, behind the nose and the roof of the mouth. They are part of the body's defense system (immune system). ?A mass in the nasopharynx. ?Damage to the ear caused by pressure changes (barotrauma). ?What increases the risk? ?You are more likely to develop this condition if you: ?Smoke or are exposed to tobacco smoke. ?Have an opening in the roof of your mouth (cleft palate). ?Have gastroesophageal reflux. ?Have an immune system disorder. ?What are the signs or symptoms? ?Symptoms of this condition include: ?Ear pain. ?Fever. ?Decreased hearing. ?Tiredness  (lethargy). ?Fluid leaking from the ear, if the eardrum is ruptured or has burst. ?Ringing in the ear. ?How is this diagnosed? ? ?This condition is diagnosed with a physical exam. During the exam, your health care provider will use an instrument called an otoscope to look in your ear and check for redness, swelling, and fluid. He or she will also ask about your symptoms. ?Your health care provider may also order tests, such as: ?A pneumatic otoscopy. This is a test to check the movement of the eardrum. It is done by squeezing a small amount of air into the ear. ?A tympanogram. This is a test that shows how well the eardrum moves in response to air pressure in the ear canal. It provides a graph for your health care provider to review. ?How is this treated? ?This condition can go away on its own within 3-5 days. But if the condition is caused by a bacterial infection and does not go away on its own, or if it keeps coming back, your health care provider may: ?Prescribe antibiotic medicine to treat the infection. ?Prescribe or recommend medicines to control pain. ?Follow these instructions at home: ?Take over-the-counter and prescription medicines only as told by your health care provider. ?If you were prescribed an antibiotic medicine, take it as told by your health care provider. Do not stop taking the antibiotic even if you start to feel better. ?Keep all follow-up visits. This is important. ?Contact a health care provider if: ?You have bleeding from your nose. ?There is a lump on your neck. ?You are not feeling better in 5 days. ?You feel worse instead of better. ?Get help right away if: ?You have severe pain that is not controlled with medicine. ?You have swelling, redness, or pain around your ear. ?You have stiffness in your neck. ?A part of your face is not moving (paralyzed). ?The bone behind your ear (mastoid bone) is tender when you touch it. ?You develop a severe headache. ?Summary ?Otitis media is redness,  soreness, and swelling of the middle ear, usually resulting in pain and decreased hearing. ?This condition can go away on its own within 3-5 days. ?If the problem does not go away in 3-5 days, your health care provider may give you medicines to treat the infection. ?If you were prescribed an antibiotic medicine, take it as told by your health care provider. ?Follow all instructions that were given to you by your health care provider. ?This information is not intended to replace advice given to you by your health care provider. Make sure you discuss any questions you have with your health care provider. ?Document Revised: 04/20/2020 Document Reviewed: 04/20/2020 ?Elsevier Patient Education ? 2023 [employer] ? ? ? ?If you have been instructed to have an in-person evaluation today at a local Urgent Care facility, please use the link below. It will take you to a list of all of our available  [facility], including address, phone number and hours of operation. Please do not delay care.  ?[facility] ? ?If you or a family member do not have a primary care provider, use the link below to schedule a visit and establish care. When you choose a [facility] primary care physician or advanced practice provider, you gain a long-term partner in health. ?Find a Primary Care Provider ? ?Learn more about [facility] in-office and virtual care options: ?[facility] ?

## 2021-06-04 NOTE — Progress Notes (Signed)
?Virtual Visit Consent  ? ?Alicia [name], you are scheduled for a virtual visit with a [facility] provider today. Just as with appointments in the office, your consent must be obtained to participate. Your consent will be active for this visit and any virtual visit you may have with one of our providers in the next 365 days. If you have a MyChart account, a copy of this consent can be sent to you electronically. ? ?As this is a virtual visit, video technology does not allow for your provider to perform a traditional examination. This may limit your provider's ability to fully assess your condition. If your provider identifies any concerns that need to be evaluated in person or the need to arrange testing (such as labs, EKG, etc.), we will make arrangements to do so. Although advances in technology are sophisticated, we cannot ensure that it will always work on either your end or our end. If the connection with a video visit is poor, the visit may have to be switched to a telephone visit. With either a video or telephone visit, we are not always able to ensure that we have a secure connection. ? ?By engaging in this virtual visit, you consent to the provision of healthcare and authorize for your insurance to be billed (if applicable) for the services provided during this visit. Depending on your insurance coverage, you may receive a charge related to this service. ? ?I need to obtain your verbal consent now. Are you willing to proceed with your visit today? Alicia [name] has provided verbal consent on 06/04/2021 for a virtual visit (video or telephone). [name], PA-C ? ?Date: 06/04/2021 9:39 AM ? ?Virtual Visit via Video Note  ? ?Alicia [name], connected with  Alicia [name]  ([id number], 30-Jan-1990) on 06/04/21 at  9:45 AM EDT by a video-enabled telemedicine application and verified that I am speaking with the correct person using two identifiers. ? ?Location: ?Patient: Virtual Visit Location  Patient: Mobile ?Provider: Virtual Visit Location Provider: Home Office ?  ?I discussed the limitations of evaluation and management by telemedicine and the availability of in person appointments. The patient expressed understanding and agreed to proceed.   ? ?History of Present Illness: ?Alicia [name] is a 31 y.o. who identifies as a female who was assigned female at birth, and is being seen today for otalgia. ? ?HPI: Otalgia  ?There is pain in the left ear. This is a new problem. The current episode started 1 to 4 weeks ago. The problem occurs constantly. The problem has been gradually worsening. There has been no fever. Associated symptoms include headaches and hearing loss. Pertinent negatives include no coughing, ear discharge, rhinorrhea or sore throat. She has tried NSAIDs and acetaminophen for the symptoms. The treatment provided no relief. There is no history of a chronic ear infection, hearing loss or a tympanostomy tube.   ? ? ?Problems:  ?Patient Active Problem List  ? Diagnosis Date Noted  ? GAD (generalized anxiety disorder) 03/29/2018  ? Panic disorder 08/16/2017  ?  ?Allergies:  ?Allergies  ?Allergen Reactions  ? Lexapro [Escitalopram Oxalate] Diarrhea and Nausea Only  ?  Headache  ? Ajovy [Fremanezumab-Vfrm] Swelling and Other (See Comments)  ?  Sores in mouth  ? ?Medications:  ?Current Outpatient Medications:  ?  amoxicillin-clavulanate (AUGMENTIN) 875-125 MG tablet, Take 1 tablet by mouth 2 (two) times daily., Disp: 14 tablet, Rfl: 0 ?  neomycin-polymyxin-hydrocortisone (CORTISPORIN) OTIC solution, Place 3 drops into the  left ear 4 (four) times daily. X 7 days, Disp: 10 mL, Rfl: 0 ?  albuterol (VENTOLIN HFA) 108 (90 Base) MCG/ACT inhaler, Inhale 2 puffs into the lungs 2 (two) times daily for 5 days., Disp: 1 each, Rfl: 3 ?  benzonatate (TESSALON) 200 MG capsule, Take 1 capsule (200 mg total) by mouth 2 (two) times daily as needed for cough., Disp: 20 capsule, Rfl: 0 ?  Biotin 2.5 MG CAPS, Take  by mouth., Disp: , Rfl:  ?  fexofenadine (ALLEGRA) 60 MG tablet, Take 60 mg by mouth 2 (two) times daily., Disp: , Rfl:  ?  SUMAtriptan (IMITREX) 25 MG tablet, Take 1 tablet (25 mg total) by mouth every 2 (two) hours as needed for migraine (Maximum 2 tablets in 24 hours.). May repeat in 2 hours if headache persists or recurs., Disp: 10 tablet, Rfl: 5 ?  topiramate (TOPAMAX) 50 MG tablet, Take 1 tablet (50 mg total) by mouth daily., Disp: 30 tablet, Rfl: 5 ? ?Observations/Objective: ?Patient is well-developed, well-nourished in no acute distress.  ?Resting comfortably  ?Head is normocephalic, atraumatic.  ?No labored breathing.  ?Speech is clear and coherent with logical content.  ?Patient is alert and oriented at baseline.  ? ? ?Assessment and Plan: ?1. Non-recurrent acute suppurative otitis media of left ear without spontaneous rupture of tympanic membrane ?- amoxicillin-clavulanate (AUGMENTIN) 875-125 MG tablet; Take 1 tablet by mouth 2 (two) times daily.  Dispense: 14 tablet; Refill: 0 ?- neomycin-polymyxin-hydrocortisone (CORTISPORIN) OTIC solution; Place 3 drops into the left ear 4 (four) times daily. X 7 days  Dispense: 10 mL; Refill: 0 ? ?- Worsening symptoms that have not responded to OTC medications.  ?- Will give Augmentin and Cortisporin drops ?- Continue allergy medications.  ?- Steam and humidifier can help ?- Stay well hydrated and get plenty of rest.  ?- Seek in person evaluation if no symptom improvement or if symptoms worsen ? ?Follow Up Instructions: ?I discussed the assessment and treatment plan with the patient. The patient was provided an opportunity to ask questions and all were answered. The patient agreed with the plan and demonstrated an understanding of the instructions.  A copy of instructions were sent to the patient via MyChart unless otherwise noted below.  ? ? ?The patient was advised to call back or seek an in-person evaluation if the symptoms worsen or if the condition fails to  improve as anticipated. ? ?Time:  ?I spent 10 minutes with the patient via telehealth technology discussing the above problems/concerns.   ? ?[name], PA-C ?

## 2021-09-02 ENCOUNTER — Other Ambulatory Visit: Payer: Self-pay | Admitting: Endocrinology

## 2021-09-02 DIAGNOSIS — E221 Hyperprolactinemia: Secondary | ICD-10-CM

## 2021-09-12 ENCOUNTER — Ambulatory Visit
Admission: RE | Admit: 2021-09-12 | Discharge: 2021-09-12 | Disposition: A | Discharge status: Home / Self Care | Payer: Managed Care, Other (non HMO) | Source: Ambulatory Visit | Attending: Endocrinology | Admitting: Endocrinology

## 2021-09-12 DIAGNOSIS — E221 Hyperprolactinemia: Secondary | ICD-10-CM

## 2021-09-12 MED ORDER — GADOBENATE DIMEGLUMINE 529 MG/ML IV SOLN
7.0000 mL | Freq: Once | INTRAVENOUS | Status: AC | PRN
  Administered 2021-09-12: 7 mL via INTRAVENOUS

## 2021-09-13 ENCOUNTER — Ambulatory Visit (INDEPENDENT_AMBULATORY_CARE_PROVIDER_SITE_OTHER): Payer: Managed Care, Other (non HMO) | Admitting: Emergency Medicine

## 2021-09-13 ENCOUNTER — Encounter: Payer: Self-pay | Admitting: Emergency Medicine

## 2021-09-13 VITALS — BP 110/62 | HR 70 | Temp 97.8°F | Ht 65.0 in | Wt 140.0 lb

## 2021-09-13 DIAGNOSIS — Z0000 Encounter for general adult medical examination without abnormal findings: Secondary | ICD-10-CM | POA: Diagnosis not present

## 2021-09-13 DIAGNOSIS — Z1329 Encounter for screening for other suspected endocrine disorder: Secondary | ICD-10-CM

## 2021-09-13 DIAGNOSIS — Z1159 Encounter for screening for other viral diseases: Secondary | ICD-10-CM | POA: Diagnosis not present

## 2021-09-13 DIAGNOSIS — Z130 Encounter for screening for diseases of the blood and blood-forming organs and certain disorders involving the immune mechanism: Secondary | ICD-10-CM

## 2021-09-13 DIAGNOSIS — Z13220 Encounter for screening for lipoid disorders: Secondary | ICD-10-CM

## 2021-09-13 DIAGNOSIS — Z13228 Encounter for screening for other metabolic disorders: Secondary | ICD-10-CM

## 2021-09-13 LAB — HEMOGLOBIN A1C: Hgb A1c MFr Bld: 5.2 % (ref 4.6–6.5)

## 2021-09-13 LAB — CBC WITH DIFFERENTIAL/PLATELET
Basophils Absolute: 0.1 10*3/uL (ref 0.0–0.1)
Basophils Relative: 1.5 % (ref 0.0–3.0)
Eosinophils Absolute: 0.4 10*3/uL (ref 0.0–0.7)
Eosinophils Relative: 5.4 % — ABNORMAL HIGH (ref 0.0–5.0)
HCT: 39 % (ref 36.0–46.0)
Hemoglobin: 13.1 g/dL (ref 12.0–15.0)
Lymphocytes Relative: 18 % (ref 12.0–46.0)
Lymphs Abs: 1.5 10*3/uL (ref 0.7–4.0)
MCHC: 33.6 g/dL (ref 30.0–36.0)
MCV: 94.2 fl (ref 78.0–100.0)
Monocytes Absolute: 0.5 10*3/uL (ref 0.1–1.0)
Monocytes Relative: 6 % (ref 3.0–12.0)
Neutro Abs: 5.6 10*3/uL (ref 1.4–7.7)
Neutrophils Relative %: 69.1 % (ref 43.0–77.0)
Platelets: 251 10*3/uL (ref 150.0–400.0)
RBC: 4.13 Mil/uL (ref 3.87–5.11)
RDW: 13.1 % (ref 11.5–15.5)
WBC: 8.1 10*3/uL (ref 4.0–10.5)

## 2021-09-13 LAB — COMPREHENSIVE METABOLIC PANEL
ALT: 23 U/L (ref 0–35)
AST: 22 U/L (ref 0–37)
Albumin: 4.4 g/dL (ref 3.5–5.2)
Alkaline Phosphatase: 73 U/L (ref 39–117)
BUN: 10 mg/dL (ref 6–23)
CO2: 21 mEq/L (ref 19–32)
Calcium: 9.2 mg/dL (ref 8.4–10.5)
Chloride: 107 mEq/L (ref 96–112)
Creatinine, Ser: 0.98 mg/dL (ref 0.40–1.20)
GFR: 76.61 mL/min (ref >60.00)
Glucose, Bld: 81 mg/dL (ref 70–99)
Potassium: 3.8 mEq/L (ref 3.5–5.1)
Sodium: 136 mEq/L (ref 135–145)
Total Bilirubin: 0.4 mg/dL (ref 0.2–1.2)
Total Protein: 7.5 g/dL (ref 6.0–8.3)

## 2021-09-13 LAB — LIPID PANEL
Cholesterol: 143 mg/dL (ref 0–200)
HDL: 43.8 mg/dL (ref >39.00)
LDL Cholesterol: 82 mg/dL (ref 0–99)
NonHDL: 99.23
Total CHOL/HDL Ratio: 3
Triglycerides: 85 mg/dL (ref 0.0–149.0)
VLDL: 17 mg/dL (ref 0.0–40.0)

## 2021-09-13 NOTE — Patient Instructions (Signed)

## 2021-09-13 NOTE — Progress Notes (Signed)
Alicia [name] 31 y.o.   Chief Complaint  Patient presents with   Annual Exam    HISTORY OF PRESENT ILLNESS: This is a 30 y.o. female here for annual exam. Has history of migraine headaches and prolactinoma. Had brain MRI done yesterday.  Results not available yet. No complaints or medical concerns today.  HPI   Prior to Admission medications   Medication Sig Start Date End Date Taking? Authorizing Provider  fexofenadine (ALLEGRA) 60 MG tablet Take 60 mg by mouth 2 (two) times daily.   Yes [provider]  SUMAtriptan (IMITREX) 25 MG tablet Take 1 tablet (25 mg total) by mouth every 2 (two) hours as needed for migraine (Maximum 2 tablets in 24 hours.). May repeat in 2 hours if headache persists or recurs. 05/17/21  Yes Jaffe, Adam R, DO  topiramate (TOPAMAX) 50 MG tablet Take 1 tablet (50 mg total) by mouth daily. 05/17/21  Yes Jaffe, Adam R, DO  albuterol (VENTOLIN HFA) 108 (90 Base) MCG/ACT inhaler Inhale 2 puffs into the lungs 2 (two) times daily for 5 days. Patient not taking: Reported on 09/13/2021 04/13/21 04/18/21  Georgina Quint, MD  sertraline (ZOLOFT) 50 MG tablet Take 1 tablet (50 mg total) by mouth daily. 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    Past Surgical History:  Procedure Laterality Date   CESAREAN SECTION N/A 05/25/2016   Procedure: CESAREAN SECTION;  Surgeon: Mitchel Honour, DO;  Location: [facility];  Service: Obstetrics;  Laterality: N/A;  PRIMARY EDC 05/27/16 NKDA Heather K, RNFA   NO PAST SURGERIES      Social History   Socioeconomic History   Marital status: Single    Spouse name: Not on file   Number of children: Not on file   Years of education: Not on file   Highest education level: Not on file  Occupational History   Not on file  Tobacco Use   Smoking status: Never   Smokeless tobacco: Never  Vaping Use   Vaping Use: Never used  Substance and Sexual Activity   Alcohol use: Yes    Comment: rare   Drug use: No   Sexual activity: Yes    Birth control/protection: Pill  Other Topics Concern   Not on file  Social History Narrative   Not on file   Social Determinants of Health   Financial Resource Strain: Not on file  Food Insecurity: Not on file  Transportation Needs: Not on file  Physical Activity: Not on file  Stress: Not on file  Social Connections: Not on file  Intimate Partner Violence: Not on file    Family History  Problem Relation Age of Onset   Hypertension Mother    Migraines Father    COPD Father    Migraines Sister    Migraines Brother    Stroke  Maternal Grandmother    Diabetes Maternal Grandmother    Hyperlipidemia Maternal Grandmother    Asthma Maternal Grandfather    COPD Maternal Grandfather    Hyperlipidemia Maternal Grandfather    Migraines Paternal Grandmother    Dementia Paternal Grandmother    Alzheimer's disease Paternal Grandfather    Other Cousin        duplicate 15 Q syndrome     Review of Systems  Constitutional: Negative.  Negative for chills and fever.  HENT: Negative.  Negative for congestion and sore throat.   Eyes: Negative.   Respiratory: Negative.  Negative for cough and shortness of breath.   Cardiovascular: Negative.  Negative for chest pain.  Gastrointestinal:  Negative for abdominal pain, diarrhea, nausea and vomiting.  Genitourinary: Negative.  Negative for  dysuria.  Musculoskeletal: Negative.   Skin: Negative.  Negative for rash.  Neurological:  Positive for headaches. Negative for dizziness.  All other systems reviewed and are negative.   Today's Vitals   09/13/21 0757  BP: 110/62  Pulse: 70  Temp: 97.8 F (36.6 C)  TempSrc: Temporal  SpO2: 100%  Weight: 140 lb (63.5 kg)  Height: 5\' 5"  (1.651 m)   Body mass index is 23.3 kg/m.  Physical Exam Vitals reviewed.  Constitutional:      Appearance: Normal appearance.  HENT:     Head: Normocephalic.     Right Ear: Tympanic membrane, ear canal and external ear normal.     Left Ear: Tympanic membrane, ear canal and external ear normal.     Mouth/Throat:     Mouth: Mucous membranes are moist.     Pharynx: Oropharynx is clear.  Eyes:     Extraocular Movements: Extraocular movements intact.     Conjunctiva/sclera: Conjunctivae normal.     Pupils: Pupils are equal, round, and reactive to light.  Cardiovascular:     Rate and Rhythm: Normal rate and regular rhythm.     Pulses: Normal pulses.     Heart sounds: Normal heart sounds.  Pulmonary:     Effort: Pulmonary effort is normal.     Breath sounds: Normal breath sounds.  Abdominal:     General: There is no distension.     Palpations: Abdomen is soft.     Tenderness: There is no abdominal tenderness.  Musculoskeletal:        General: Normal range of motion.     Cervical back: No tenderness.  Lymphadenopathy:     Cervical: No cervical adenopathy.  Skin:    General: Skin is warm and dry.     Capillary Refill: Capillary refill takes less than 2 seconds.  Neurological:     General: No focal deficit present.     Mental Status: She is alert and oriented to person, place, and time.  Psychiatric:        Mood and Affect: Mood normal.        Behavior: Behavior normal.     ASSESSMENT & PLAN: Problem List Items Addressed This Visit   None Visit Diagnoses     Routine general medical examination at a health care facility    -   Primary   Need for hepatitis C screening test       Relevant Orders   Hepatitis C antibody screen   Screening for deficiency anemia       Relevant Orders   CBC with Differential   Screening for lipoid disorders       Relevant Orders   Lipid panel  Screening for endocrine, metabolic and immunity disorder       Relevant Orders   Comprehensive metabolic panel   Hemoglobin A1c     Modifiable risk factors discussed with patient. Anticipatory guidance according to age provided. The following topics were also discussed: Social Determinants of Health Smoking.  Non-smoker Diet and nutrition.  Good eating habits Benefits of exercise.  Stays physically active Cancer family history review Vaccinations recommendations Cardiovascular risk assessment and need for blood work Mental health including depression and anxiety Fall and accident prevention  Patient Instructions  Health Maintenance, Female Adopting a healthy lifestyle and getting preventive care are important in promoting health and wellness. Ask your health care provider about: The right schedule for you to have regular tests and exams. Things you can do on your own to prevent diseases and keep yourself healthy. What should I know about diet, weight, and exercise? Eat a healthy diet  Eat a diet that includes plenty of vegetables, fruits, low-fat dairy products, and lean protein. Do not eat a lot of foods that are high in solid fats, added sugars, or sodium. Maintain a healthy weight Body mass index (BMI) is used to identify weight problems. It estimates body fat based on height and weight. Your health care provider can help determine your BMI and help you achieve or maintain a healthy weight. Get regular exercise Get regular exercise. This is one of the most important things you can do for your health. Most adults should: Exercise for at least 150 minutes each week. The exercise should increase your heart rate and make you sweat  (moderate-intensity exercise). Do strengthening exercises at least twice a week. This is in addition to the moderate-intensity exercise. Spend less time sitting. Even light physical activity can be beneficial. Watch cholesterol and blood lipids Have your blood tested for lipids and cholesterol at 32 years of age, then have this test every 5 years. Have your cholesterol levels checked more often if: Your lipid or cholesterol levels are high. You are older than 32 years of age. You are at high risk for heart disease. What should I know about cancer screening? Depending on your health history and family history, you may need to have cancer screening at various ages. This may include screening for: Breast cancer. Cervical cancer. Colorectal cancer. Skin cancer. Lung cancer. What should I know about heart disease, diabetes, and high blood pressure? Blood pressure and heart disease High blood pressure causes heart disease and increases the risk of stroke. This is more likely to develop in people who have high blood pressure readings or are overweight. Have your blood pressure checked: Every 3-5 years if you are 26-70 years of age. Every year if you are 23 years old or older. Diabetes Have regular diabetes screenings. This checks your fasting blood sugar level. Have the screening done: Once every three years after age 30 if you are at a normal weight and have a low risk for diabetes. More often and at a younger age if you are overweight or have a high risk for diabetes. What should I know about preventing infection? Hepatitis B If you have a higher risk for hepatitis B, you should be screened for this virus. Talk with your health care provider to find out if you are at risk for hepatitis B infection. Hepatitis C Testing is recommended for: Everyone born from [birth year] through 1966. Anyone with known risk factors for hepatitis C. Sexually transmitted infections (STIs) Get screened for STIs,  including gonorrhea and  chlamydia, if: You are sexually active and are younger than 32 years of age. You are older than 31 years of age and your health care provider tells you that you are at risk for this type of infection. Your sexual activity has changed since you were last screened, and you are at increased risk for chlamydia or gonorrhea. Ask your health care provider if you are at risk. Ask your health care provider about whether you are at high risk for HIV. Your health care provider may recommend a prescription medicine to help prevent HIV infection. If you choose to take medicine to prevent HIV, you should first get tested for HIV. You should then be tested every 3 months for as long as you are taking the medicine. Pregnancy If you are about to stop having your period (premenopausal) and you may become pregnant, seek counseling before you get pregnant. Take 400 to 800 micrograms (mcg) of folic acid every day if you become pregnant. Ask for birth control (contraception) if you want to prevent pregnancy. Osteoporosis and menopause Osteoporosis is a disease in which the bones lose minerals and strength with aging. This can result in bone fractures. If you are 64 years old or older, or if you are at risk for osteoporosis and fractures, ask your health care provider if you should: Be screened for bone loss. Take a calcium or vitamin D supplement to lower your risk of fractures. Be given hormone replacement therapy (HRT) to treat symptoms of menopause. Follow these instructions at home: Alcohol use Do not drink alcohol if: Your health care provider tells you not to drink. You are pregnant, may be pregnant, or are planning to become pregnant. If you drink alcohol: Limit how much you have to: 0-1 drink a day. Know how much alcohol is in your drink. In the [place] one drink equals one 12 oz bottle of beer (355 mL), one 5 oz glass of wine (148 mL), or one 1 oz glass of hard liquor (44  mL). Lifestyle Do not use any products that contain nicotine or tobacco. These products include cigarettes, chewing tobacco, and vaping devices, such as e-cigarettes. If you need help quitting, ask your health care provider. Do not use street drugs. Do not share needles. Ask your health care provider for help if you need support or information about quitting drugs. General instructions Schedule regular health, dental, and eye exams. Stay current with your vaccines. Tell your health care provider if: You often feel depressed. You have ever been abused or do not feel safe at home. Summary Adopting a healthy lifestyle and getting preventive care are important in promoting health and wellness. Follow your health care provider's instructions about healthy diet, exercising, and getting tested or screened for diseases. Follow your health care provider's instructions on monitoring your cholesterol and blood pressure. This information is not intended to replace advice given to you by your health care provider. Make sure you discuss any questions you have with your health care provider. Document Revised: 06/01/2020 Document Reviewed: 06/01/2020 Elsevier Patient Education  [place], MD [facility]

## 2021-09-14 LAB — HEPATITIS C ANTIBODY: Hepatitis C Ab: NONREACTIVE

## 2021-09-15 ENCOUNTER — Telehealth: Payer: Self-pay | Admitting: *Deleted

## 2021-09-15 NOTE — Telephone Encounter (Signed)
Called patient and left message for patient to see if she wants her form faxed or if she wants to pick up a copy. The form will be at the front desk

## 2021-09-16 ENCOUNTER — Encounter: Payer: Self-pay | Admitting: *Deleted

## 2021-11-18 ENCOUNTER — Other Ambulatory Visit: Payer: Self-pay | Admitting: Neurology

## 2021-12-08 ENCOUNTER — Ambulatory Visit
Admission: EM | Admit: 2021-12-08 | Discharge: 2021-12-08 | Disposition: A | Discharge status: Home / Self Care | Payer: Managed Care, Other (non HMO) | Attending: Urgent Care | Admitting: Urgent Care

## 2021-12-08 ENCOUNTER — Encounter: Payer: Self-pay | Admitting: Emergency Medicine

## 2021-12-08 DIAGNOSIS — R300 Dysuria: Secondary | ICD-10-CM | POA: Diagnosis not present

## 2021-12-08 DIAGNOSIS — N3000 Acute cystitis without hematuria: Secondary | ICD-10-CM | POA: Insufficient documentation

## 2021-12-08 LAB — POCT URINALYSIS DIP (MANUAL ENTRY)
Blood, UA: NEGATIVE
Glucose, UA: 100 mg/dL — AB
Nitrite, UA: POSITIVE — AB
Protein Ur, POC: 100 mg/dL — AB
Spec Grav, UA: 1.015 (ref 1.010–1.025)
Urobilinogen, UA: 2 E.U./dL — AB
pH, UA: 7 (ref 5.0–8.0)

## 2021-12-08 LAB — POCT URINE PREGNANCY: Preg Test, Ur: NEGATIVE

## 2021-12-08 MED ORDER — CEPHALEXIN 500 MG PO CAPS: 500.0000 mg | ORAL_CAPSULE | Freq: Two times a day (BID) | ORAL | 0 refills | Status: DC

## 2021-12-08 NOTE — Discharge Instructions (Signed)
Please start Keflex to address an urinary tract infection. Make sure you hydrate very well with plain water and a quantity of 64-80 ounces of water a day.  Please limit drinks that are considered urinary irritants such as soda, sweet tea, coffee, energy drinks, alcohol.  These can worsen your urinary and genital symptoms but also be the source of them.  I will let you know about your urine culture results through MyChart to see if we need to prescribe or change your antibiotics based off of those results.  

## 2021-12-08 NOTE — ED Provider Notes (Signed)
[facility]  Note:  This document was prepared using Conservation officer, historic buildings and may include unintentional dictation errors.  MRN: [id number] DOB: Jun 03, 1990  Subjective:   Alicia [name] is a 31 y.o. female presenting for 4-day history of acute onset persistent dysuria, urinary frequency, urinary urgency, pelvic pain and pressure. LMP was 11/22/2021, was regular.  Has a history of UTIs, last one was about a year and a half ago.  This resolved with antibiotics.  Hydrates well.  Has 1 cup of coffee and 1 small soda daily.  No concern for STI.  No current facility-administered medications for this encounter.  Current Outpatient Medications:    topiramate (TOPAMAX) 50 MG tablet, TAKE 1 TABLET(50 MG) BY MOUTH DAILY, Disp: 30 tablet, Rfl: 2   albuterol (VENTOLIN HFA) 108 (90 Base) MCG/ACT inhaler, Inhale 2 puffs into the lungs 2 (two) times daily for 5 days. (Patient not taking: Reported on 09/13/2021), Disp: 1 each, Rfl: 3   SUMAtriptan (IMITREX) 25 MG tablet, Take 1 tablet (25 mg total) by mouth every 2 (two) hours as needed for migraine (Maximum 2 tablets in 24 hours.). 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 treatment     Past Surgical History:  Procedure Laterality Date   CESAREAN SECTION N/A 05/25/2016   Procedure: CESAREAN SECTION;  Surgeon: Mitchel Honour, DO;  Location: [facility];  Service: Obstetrics;  Laterality: N/A;  PRIMARY EDC 05/27/16 NKDA Heather K, RNFA   NO PAST SURGERIES      Family History   Problem Relation Age of Onset   Hypertension Mother    Migraines Father    COPD Father    Migraines Sister    Migraines Brother    Stroke Maternal Grandmother    Diabetes Maternal Grandmother    Hyperlipidemia Maternal Grandmother    Asthma Maternal Grandfather    COPD Maternal Grandfather    Hyperlipidemia Maternal Grandfather    Migraines Paternal Grandmother    Dementia Paternal Grandmother    Alzheimer's disease Paternal Grandfather    Other Cousin        duplicate 15 Q syndrome    Social History   Tobacco Use   Smoking status: Never   Smokeless tobacco: Never  Vaping Use   Vaping Use: Never used  Substance Use Topics   Alcohol use: Yes    Comment: rare   Drug use: No    ROS   Objective:   Vitals: BP 117/71 (BP Location: Left Arm)   Pulse 68   Temp 97.8 F (36.6 C) (Oral)   Resp 16   SpO2 96%   Physical Exam Constitutional:      General: She is not in acute distress.    Appearance: Normal appearance. She is well-developed. She is not ill-appearing, toxic-appearing or diaphoretic.  HENT:     Head: Normocephalic and atraumatic.     Nose: Nose normal.     Mouth/Throat:     Mouth: Mucous membranes are  moist.     Pharynx: Oropharynx is clear.  Eyes:     General: No scleral icterus.       Right eye: No discharge.        Left eye: No discharge.     Extraocular Movements: Extraocular movements intact.     Conjunctiva/sclera: Conjunctivae normal.  Cardiovascular:     Rate and Rhythm: Normal rate.  Pulmonary:     Effort: Pulmonary effort is normal.  Abdominal:     General: Bowel sounds are normal. There is no distension.     Palpations: Abdomen is soft. There is no mass.     Tenderness: There is no abdominal tenderness. There is no right CVA tenderness, left CVA tenderness, guarding or rebound.  Skin:    General: Skin is warm and dry.  Neurological:     General: No focal deficit present.     Mental Status: She is alert and oriented to person,  place, and time.  Psychiatric:        Mood and Affect: Mood normal.        Behavior: Behavior normal.        Thought Content: Thought content normal.        Judgment: Judgment normal.     Results for orders placed or performed during the hospital encounter of 12/08/21 (from the past 24 hour(s))  POCT urinalysis dipstick     Status: Abnormal   Collection Time: 12/08/21  8:42 AM  Result Value Ref Range   Color, UA orange (A) yellow   Clarity, UA clear clear   Glucose, UA =100 (A) negative mg/dL   Bilirubin, UA small (A) negative   Ketones, POC UA trace (5) (A) negative mg/dL   Spec Grav, UA 1.015 1.010 - 1.025   Blood, UA negative negative   pH, UA 7.0 5.0 - 8.0   Protein Ur, POC =100 (A) negative mg/dL   Urobilinogen, UA 2.0 (A) 0.2 or 1.0 E.U./dL   Nitrite, UA Positive (A) Negative   Leukocytes, UA Large (3+) (A) Negative  POCT urine pregnancy     Status: None   Collection Time: 12/08/21  8:48 AM  Result Value Ref Range   Preg Test, Ur Negative Negative    Assessment and Plan :   PDMP not reviewed this encounter.  1. Acute cystitis without hematuria   2. Dysuria     Start Keflex to cover for acute cystitis, urine culture pending.  Recommended aggressive hydration, limiting urinary irritants. Counseled patient on potential for adverse effects with medications prescribed/recommended today, ER and return-to-clinic precautions discussed, patient verbalized understanding.    Jaynee Eagles, [place] 12/08/21 [phone]

## 2021-12-08 NOTE — ED Triage Notes (Signed)
Dysuria, suprapubic pain/tenderness, urinary frequency starting Saturday. Taking AZO at home with mild improvement. Denies fevers, back pain, reports pink tinged urine but denies gross blood, vaginal/rectal issues.

## 2021-12-11 LAB — URINE CULTURE: Culture: >=100000 — AB

## 2021-12-13 ENCOUNTER — Telehealth (HOSPITAL_COMMUNITY): Payer: Self-pay

## 2021-12-13 MED ORDER — NITROFURANTOIN MONOHYD MACRO 100 MG PO CAPS: 100.0000 mg | ORAL_CAPSULE | Freq: Two times a day (BID) | ORAL | 0 refills | Status: DC

## 2022-01-31 ENCOUNTER — Encounter: Payer: Self-pay | Admitting: Neurology

## 2022-02-02 NOTE — Progress Notes (Unsigned)
NEUROLOGY FOLLOW UP OFFICE NOTE  [name] [id number]  Assessment/Plan:   Migraine with aura, without status migrainosus, not intractable   Migraine prevention:  Topiramate 50mg  at bedtime Migraine rescue:  sumatriptan 25mg  as first line. Limit use of pain relievers to no more than 2 days out of week to prevent risk of rebound or medication-overuse headache. Keep headache diary Follow up 9 months       Subjective:  Alicia [name] is a 32 year old female with mood disorder who follows up for migraines.   UPDATE: Intensity:  moderate Duration:  usually 2 hours.   Frequency:  1 to 2 times every 3 months Frequency of abortive medication: 1 to times every 3 months.  Only auras occasionally.    Rescue protocol:  sumatriptan Current NSAIDS/analgesics:  none Current triptans:  sumatriptan 25mg  Current ergotamine:  none Current anti-emetic:  none Current muscle relaxants:  none Current Antihypertensive medications:  none Current Antidepressant medications:  none Current Anticonvulsant medications:  topiramate 50mg  QHS Current anti-CGRP:  none Current Vitamins/Herbal/Supplements:  none Current Antihistamines/Decongestants:  none Other therapy:  none Hormone/birth control:  none  She had an MRI of brain and pituitary with and without contrast on 09/12/2021 for evaluation of elevated prolactin levels, which was personally reviewed and revealed a single nonspecific punctate focus in the high right frontal white matter as well as 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    Caffeine:  1 cup black tea every morning.   Diet:  Rarely soda.  Eating healthier.   Exercise:  good Depression/Anxiety:  yes.  History of panic attacks. Other pain:  none Sleep hygiene:  varies.     HISTORY:  In highschool, she started having headaches described severe right-sided pounding/throbbing pain with nausea, photophobia,  phonophobia, preceded by tunnel vision/spots in vision, and when severe she sees change in colors. Prodrome with difficulty focusing.  They last 24 to 36 hours. Topiramate was helpful.  On topiramate, they occurred once a month.  Topiramate was discontinued after her pharmacist informed her that it may lower efficacy of her birth control medication.  They occur 3-4 times a week since taken off of topiramate.   Triggers:  emotional stress, menses.  Relieving factors:  heat to back of neck and cold compress on forehead.       Past NSAIDS/analgesics:  Excedrin Migraine, Tylenol Past abortive triptans:  none Past abortive ergotamine:  none Past muscle relaxants:  none Past anti-emetic:  Zofran, promethazine Past antihypertensive medications:  none Past antidepressant medications:  sertraline, escitalopram Past anticonvulsant medications:  topiramate 25mg  BID Past anti-CGRP:  Ajovy (tongue swelling/blisters) Past vitamins/Herbal/Supplements:  none Past antihistamines/decongestants:  Flonase Other past therapies:  none       Family history of headache:  paternal grandmother, father, sister, brother  PAST MEDICAL HISTORY: 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 (Patient not taking: Reported on 09/13/2021) 1 each 3   cephALEXin (KEFLEX) 500 MG capsule Take 1 capsule (500 mg total) by mouth  2 (two) times daily. 10 capsule 0   fexofenadine (ALLEGRA) 60 MG tablet Take 60 mg by mouth 2 (two) times daily.     nitrofurantoin, macrocrystal-monohydrate, (MACROBID) 100 MG capsule Take 1 capsule (100 mg  total) by mouth 2 (two) times daily. 10 capsule 0   SUMAtriptan (IMITREX) 25 MG tablet Take 1 tablet (25 mg total) by mouth every 2 (two) hours as needed for migraine (Maximum 2 tablets in 24 hours.). May repeat in 2 hours if headache persists or recurs. 10 tablet 5   topiramate (TOPAMAX) 50 MG tablet TAKE 1 TABLET(50 MG) BY MOUTH DAILY 30 tablet 2   [DISCONTINUED] sertraline (ZOLOFT) 50 MG tablet Take 1 tablet (50 mg total) by mouth daily. (Patient not taking: Reported on 05/19/2019) 90 tablet 1   No current facility-administered medications on file prior to visit.    ALLERGIES: Allergies  Allergen Reactions   Lexapro [Escitalopram Oxalate] Diarrhea and Nausea Only    Headache   Ajovy [Fremanezumab-Vfrm] Swelling and Other (See Comments)    Sores in mouth    FAMILY HISTORY: Family History  Problem Relation Age of Onset   Hypertension Mother    Migraines Father    COPD Father    Migraines Sister    Migraines Brother    Stroke Maternal Grandmother    Diabetes Maternal Grandmother    Hyperlipidemia Maternal Grandmother    Asthma Maternal Grandfather    COPD Maternal Grandfather    Hyperlipidemia Maternal Grandfather    Migraines Paternal Grandmother    Dementia Paternal Grandmother    Alzheimer's disease Paternal Grandfather    Other Cousin        duplicate 15 Q syndrome      Objective:  *** General: No acute distress.  Patient appears ***-groomed.   Head:  Normocephalic/atraumatic Eyes:  Fundi examined but not visualized Neck: supple, no paraspinal tenderness, full range of motion Heart:  Regular rate and rhythm Lungs:  Clear to auscultation bilaterally Back: No paraspinal tenderness Neurological Exam: alert and oriented to person, place, and time.  Speech fluent and not dysarthric, language intact.  CN II-XII intact. Bulk and tone normal, muscle strength 5/5 throughout.  Sensation to light touch intact.  Deep tendon reflexes 2+ throughout, toes downgoing.  Finger to  nose testing intact.  Gait normal, Romberg negative.   Metta Clines, DO  CC: ***

## 2022-02-03 ENCOUNTER — Ambulatory Visit: Payer: Managed Care, Other (non HMO) | Admitting: Neurology

## 2022-02-03 ENCOUNTER — Encounter: Payer: Self-pay | Admitting: Neurology

## 2022-02-03 VITALS — BP 117/71 | HR 73 | Ht 65.0 in | Wt 143.0 lb

## 2022-02-03 DIAGNOSIS — G43109 Migraine with aura, not intractable, without status migrainosus: Secondary | ICD-10-CM | POA: Diagnosis not present

## 2022-02-03 DIAGNOSIS — G44229 Chronic tension-type headache, not intractable: Secondary | ICD-10-CM

## 2022-02-03 MED ORDER — RIZATRIPTAN BENZOATE 10 MG PO TABS: 10.0000 mg | ORAL_TABLET | ORAL | 5 refills | Status: DC | PRN

## 2022-02-03 MED ORDER — TOPIRAMATE 100 MG PO TABS: 100.0000 mg | ORAL_TABLET | Freq: Every day | ORAL | 5 refills | Status: DC

## 2022-02-03 NOTE — Patient Instructions (Signed)
Start topiramate 100mg  - take 1/2 tablet at bedtime for one week, then increase to 1 tablet at bedtime.  If no improvement in 6 weeks on 100mg  at bedtime, contact me Stop sumatriptan.  Instead, try rizatriptan.  Let me know how it works for you.   Follow up 4-5 months.

## 2022-03-18 ENCOUNTER — Ambulatory Visit
Admission: EM | Admit: 2022-03-18 | Discharge: 2022-03-18 | Disposition: A | Discharge status: Home / Self Care | Payer: Managed Care, Other (non HMO) | Attending: Nurse Practitioner | Admitting: Nurse Practitioner

## 2022-03-18 DIAGNOSIS — N3001 Acute cystitis with hematuria: Secondary | ICD-10-CM

## 2022-03-18 DIAGNOSIS — R300 Dysuria: Secondary | ICD-10-CM | POA: Diagnosis not present

## 2022-03-18 LAB — POCT URINALYSIS DIP (MANUAL ENTRY)
Bilirubin, UA: NEGATIVE
Glucose, UA: NEGATIVE mg/dL
Ketones, POC UA: NEGATIVE mg/dL
Nitrite, UA: NEGATIVE
Protein Ur, POC: NEGATIVE mg/dL
Spec Grav, UA: 1.02 (ref 1.010–1.025)
Urobilinogen, UA: 0.2 E.U./dL
pH, UA: 6.5 (ref 5.0–8.0)

## 2022-03-18 LAB — POCT URINE PREGNANCY: Preg Test, Ur: NEGATIVE

## 2022-03-18 MED ORDER — NITROFURANTOIN MONOHYD MACRO 100 MG PO CAPS: 100.0000 mg | ORAL_CAPSULE | Freq: Two times a day (BID) | ORAL | 0 refills | Status: AC

## 2022-03-18 NOTE — ED Triage Notes (Signed)
Pt complaints of pain with urination and frequency for over three weeks.

## 2022-03-18 NOTE — ED Provider Notes (Signed)
[facility]    CSN: [id number] Arrival date & time: 03/18/22  [id number]      History   Chief Complaint Chief Complaint  Patient presents with   Dysuria    HPI Alicia [name] is a 32 y.o. female  who presents for evaluation of dysuria x 3 weeks. Patient reports associated symptoms of urinary burning and frequency. Patient reports a history of UTI's but denies history of pyelonephritis. Patient denies fever, chills, nausea, vomiting, abdominal pain, flank pain, low back pain. . No vaginal discharge or STD concern. Patient has taken nothing for symptoms. No other c/o today.    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, hx anger issues did not have treatment    Patient Active Problem List   Diagnosis Date Noted   GAD (generalized anxiety disorder) 03/29/2018   Panic disorder 08/16/2017    Past Surgical History:  Procedure Laterality Date   CESAREAN SECTION N/A 05/25/2016   Procedure: CESAREAN SECTION;  Surgeon: Linda Hedges, DO;  Location: [place] [facility];  Service: Obstetrics;  Laterality: N/A;  PRIMARY EDC 05/27/16 NKDA Heather K, RNFA   NO PAST SURGERIES      OB History     Gravida  1   Para  1   Term  1   Preterm      AB      Living  1      SAB      IAB      Ectopic      Multiple  0   Live Births  1            Home Medications    Prior to Admission medications   Medication Sig Start Date End Date Taking? Authorizing Provider  nitrofurantoin, macrocrystal-monohydrate, (MACROBID) 100 MG capsule Take 1 capsule (100 mg total) by mouth 2 (two) times daily for 7 days. 03/18/22 03/25/22 Yes Melynda Ripple, NP  albuterol (VENTOLIN HFA) 108 (90 Base) MCG/ACT inhaler Inhale 2 puffs into the lungs 2 (two) times daily for 5 days. 04/13/21 02/03/22  Horald Pollen, MD  fexofenadine (ALLEGRA) 60  MG tablet Take 60 mg by mouth daily.    [provider]  rizatriptan (MAXALT) 10 MG tablet Take 1 tablet (10 mg total) by mouth as needed for migraine. May repeat in 2 hours if needed.  Maximum 2 tablets in 24 hours. 02/03/22   Pieter Partridge, DO  SUMAtriptan (IMITREX) 25 MG tablet Take 1 tablet (25 mg total) by mouth every 2 (two) hours as needed for migraine (Maximum 2 tablets in 24 hours.). May repeat in 2 hours if headache persists or recurs. 05/17/21   Pieter Partridge, DO  topiramate (TOPAMAX) 100 MG tablet Take 1 tablet (100 mg total) by mouth at bedtime. 02/03/22   Pieter Partridge, DO  topiramate (TOPAMAX) 50 MG tablet TAKE 1 TABLET(50 MG) BY MOUTH DAILY 11/18/21   Tomi Likens, Adam R, DO  sertraline (ZOLOFT) 50 MG tablet Take 1 tablet (50 mg total) by mouth daily. 03/25/19 02/03/22  Jearld Fenton, NP    Family History Family History  Problem Relation Age of Onset   Hypertension Mother    Migraines Father    COPD Father    Migraines Sister    Migraines Brother  Stroke Maternal Grandmother    Diabetes Maternal Grandmother    Hyperlipidemia Maternal Grandmother    Asthma Maternal Grandfather    COPD Maternal Grandfather    Hyperlipidemia Maternal Grandfather    Migraines Paternal Grandmother    Dementia Paternal Grandmother    Alzheimer's disease Paternal Grandfather    Other Cousin        duplicate 63 Q syndrome    Social History Social History   Tobacco Use   Smoking status: Never   Smokeless tobacco: Never  Vaping Use   Vaping Use: Never used  Substance Use Topics   Alcohol use: Yes    Comment: rare   Drug use: No     Allergies   Lexapro [escitalopram oxalate], Ajovy [fremanezumab-vfrm], and Sertraline   Review of Systems Review of Systems  Genitourinary:  Positive for dysuria.     Physical Exam Triage Vital Signs ED Triage Vitals  Enc Vitals Group     BP 03/18/22 0831 108/68     Pulse Rate 03/18/22 0831 90     Resp 03/18/22 0831 20     Temp 03/18/22  0831 (!) 97.3 F (36.3 C)     Temp Source 03/18/22 0831 Oral     SpO2 03/18/22 0831 98 %     Weight --      Height --      Head Circumference --      Peak Flow --      Pain Score 03/18/22 0833 3     Pain Loc --      Pain Edu? --      Excl. in [place]? --    No data found.  Updated Vital Signs BP 108/68 (BP Location: Right Arm)   Pulse 90   Temp (!) 97.3 F (36.3 C) (Oral)   Resp 20   LMP 02/20/2022 (Exact Date)   SpO2 98%   Visual Acuity Right Eye Distance:   Left Eye Distance:   Bilateral Distance:    Right Eye Near:   Left Eye Near:    Bilateral Near:     Physical Exam Vitals and nursing note reviewed.  Constitutional:      Appearance: Normal appearance.  HENT:     Head: Normocephalic and atraumatic.  Eyes:     Pupils: Pupils are equal, round, and reactive to light.  Cardiovascular:     Rate and Rhythm: Normal rate.  Pulmonary:     Effort: Pulmonary effort is normal.  Abdominal:     Tenderness: There is no right CVA tenderness or left CVA tenderness.  Skin:    General: Skin is warm and dry.  Neurological:     General: No focal deficit present.     Mental Status: She is alert and oriented to person, place, and time.  Psychiatric:        Mood and Affect: Mood normal.        Behavior: Behavior normal.      UC Treatments / Results  Labs (all labs ordered are listed, but only abnormal results are displayed) Labs Reviewed  POCT URINALYSIS DIP (MANUAL ENTRY) - Abnormal; Notable for the following components:      Result Value   Blood, UA trace-intact (*)    Leukocytes, UA Small (1+) (*)    All other components within normal limits  URINE CULTURE  POCT URINE PREGNANCY    EKG   Radiology No results found.  Procedures Procedures (including critical care time)  Medications Ordered in UC Medications -  No data to display  Initial Impression / Assessment and Plan / UC Course  I have reviewed the triage vital signs and the nursing notes.  Pertinent  labs & imaging results that were available during my care of the patient were reviewed by me and considered in my medical decision making (see chart for details).     UA indicating infection, will culture and start Macrobid twice daily for 7 days Rest and fluids PCP follow-up 2 to 3 days for recheck ER precautions reviewed and patient verbalized understanding Final Clinical Impressions(s) / UC Diagnoses   Final diagnoses:  Dysuria  Acute cystitis with hematuria     Discharge Instructions      Start Macrobid twice daily for 7 days The clinic will contact you with results of urine cultures positive Rest and fluids Follow-up with your PCP if symptoms do not improve Please go to the emergency room for any worsening symptoms   ED Prescriptions     Medication Sig Dispense Auth. Provider   nitrofurantoin, macrocrystal-monohydrate, (MACROBID) 100 MG capsule Take 1 capsule (100 mg total) by mouth 2 (two) times daily for 7 days. 14 capsule Melynda Ripple, NP      PDMP not reviewed this encounter.   Melynda Ripple, NP 03/18/22 [phone]

## 2022-03-18 NOTE — Discharge Instructions (Signed)
Start Macrobid twice daily for 7 days The clinic will contact you with results of urine cultures positive Rest and fluids Follow-up with your PCP if symptoms do not improve Please go to the emergency room for any worsening symptoms

## 2022-03-20 LAB — URINE CULTURE: Culture: 10000 — AB

## 2022-03-22 ENCOUNTER — Ambulatory Visit: Payer: Medicaid Other | Admitting: Emergency Medicine

## 2022-05-19 ENCOUNTER — Encounter: Payer: Self-pay | Admitting: Neurology

## 2022-06-06 NOTE — Progress Notes (Unsigned)
NEUROLOGY FOLLOW UP OFFICE NOTE  Alicia [name] [id number]  Assessment/Plan:   1  Migraine with aura, without status migrainosus, not intractable 2  Tension-type headache, not intractable   Migraine prevention:  Topiramate 100mg  at bedtime.  She will try to improve lifestyle modification (caffeine cessation, water intake, etc) Migraine rescue:  Sumatriptan.  She will try rizatriptan 10mg .  Limit use of pain relievers to no more than 2 days out of week to prevent risk of rebound or medication-overuse headache. Keep headache diary Follow up 6 months       Subjective:  Alicia [name] is a 32 year old female with mood disorder who follows up for migraines.   UPDATE: Increased topiramate last visit.  Headaches improved.  Does notice mild memory problems.    Intensity:  mostly mild Duration:  Hasn't tried the rizatriptan yet.  Lasts 1-2 hours with sumatriptan.   Frequency:  mild tension-type headaches 5-6 days a month;  moderate-severe migraines a month. No severe migraines with the visual symptoms.   Frequency of abortive medication: sumatriptan twice a month.  Has not used Tylenol Only auras occasionally.    Current NSAIDS/analgesics:  Tylenol (not used) Current triptans:  sumatriptan; rizatriptan 10mg  (has not yet used) Current ergotamine:  none Current anti-emetic:  none Current muscle relaxants:  none Current Antihypertensive medications:  none Current Antidepressant medications:  none Current Anticonvulsant medications:  topiramate 100mg  QHS Current anti-CGRP:  none Current Vitamins/Herbal/Supplements:  none Current Antihistamines/Decongestants:  none Other therapy:  heat mask, massage Hormone/birth control:  none  She had an MRI of brain and pituitary with and without contrast on 09/12/2021 for evaluation of elevated prolactin levels, which was personally reviewed and revealed a single nonspecific punctate focus in the high right frontal white matter as well as  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    Caffeine:  1 cup black tea every morning.  However, increased soda Diet:  Less water.  Increased soda.  Eating healthier.   Exercise:  stopped exercise due to wrist injury. Depression/Anxiety:  yes.  History of panic attacks. Other pain:  none Sleep hygiene:  Poor.  Getting less sleep related to her daughter not sleeping well.    HISTORY:  In highschool, she started having headaches described severe right-sided pounding/throbbing pain with nausea, photophobia, phonophobia, preceded by tunnel vision/spots in vision, and when severe she sees change in colors. Prodrome with difficulty focusing.  They last 24 to 36 hours. Topiramate was helpful.  On topiramate, they occurred once a month.  Topiramate was discontinued after her pharmacist informed her that it may lower efficacy of her birth control medication.  They occur 3-4 times a week since taken off of topiramate.   Triggers:  emotional stress, menses.  Relieving factors:  heat to back of neck and cold compress on forehead.       Past NSAIDS/analgesics:  Excedrin Migraine, Tylenol Past abortive triptans:  sumatriptan tab Past abortive ergotamine:  none Past muscle relaxants:  none Past anti-emetic:  Zofran, promethazine Past antihypertensive medications:  none Past antidepressant medications:  sertraline, escitalopram Past anticonvulsant medications:  none Past anti-CGRP:  Ajovy (tongue swelling/blisters) Past vitamins/Herbal/Supplements:  none Past antihistamines/decongestants:  Flonase Other past therapies:  none       Family history of headache:  paternal grandmother, father, sister, brother  PAST MEDICAL HISTORY: Past Medical History:  Diagnosis Date   Childhood asthma    Frequent headaches    Headache  Medical history non-contributory    Mental disorder    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 1 each 3   fexofenadine (ALLEGRA) 60 MG tablet Take 60 mg by mouth daily.     rizatriptan (MAXALT) 10 MG tablet Take 1 tablet (10 mg total) by mouth as needed for migraine. May repeat in 2 hours if needed.  Maximum 2 tablets in 24 hours. 10 tablet 5   SUMAtriptan (IMITREX) 25 MG tablet Take 1 tablet (25 mg total) by mouth every 2 (two) hours as needed for migraine (Maximum 2 tablets in 24 hours.). May repeat in 2 hours if headache persists or recurs. 10 tablet 5   topiramate (TOPAMAX) 100 MG tablet Take 1 tablet (100 mg total) by mouth at bedtime. 30 tablet 5   topiramate (TOPAMAX) 50 MG tablet TAKE 1 TABLET(50 MG) BY MOUTH DAILY 30 tablet 2   [DISCONTINUED] sertraline (ZOLOFT) 50 MG tablet Take 1 tablet (50 mg total) by mouth daily. 90 tablet 1   No current facility-administered medications on file prior to visit.     ALLERGIES: Allergies  Allergen Reactions   Lexapro [Escitalopram Oxalate] Diarrhea and Nausea Only    Headache   Ajovy [Fremanezumab-Vfrm] Swelling and Other (See Comments)    Sores in mouth   Sertraline Other (See Comments)    FAMILY HISTORY: Family History  Problem Relation Age of Onset   Hypertension Mother    Migraines Father    COPD Father    Migraines Sister    Migraines Brother    Stroke Maternal Grandmother    Diabetes Maternal Grandmother    Hyperlipidemia Maternal Grandmother    Asthma Maternal Grandfather    COPD Maternal Grandfather    Hyperlipidemia Maternal Grandfather    Migraines Paternal Grandmother    Dementia Paternal Grandmother    Alzheimer's disease Paternal Grandfather    Other Cousin        duplicate 15 Q syndrome      Objective:  Blood pressure 117/67, pulse 82, height 5\' 5"  (1.651 m), weight 142 lb  (64.4 kg), SpO2 98 %. General: No acute distress.  Patient appears well-groomed.      Shon Millet, DO  CC: Georgina Quint, MD

## 2022-06-08 ENCOUNTER — Encounter: Payer: Self-pay | Admitting: Neurology

## 2022-06-08 ENCOUNTER — Ambulatory Visit: Payer: Managed Care, Other (non HMO) | Admitting: Neurology

## 2022-06-08 VITALS — BP 117/67 | HR 82 | Ht 65.0 in | Wt 142.0 lb

## 2022-06-08 DIAGNOSIS — G44219 Episodic tension-type headache, not intractable: Secondary | ICD-10-CM | POA: Diagnosis not present

## 2022-06-08 DIAGNOSIS — G43109 Migraine with aura, not intractable, without status migrainosus: Secondary | ICD-10-CM

## 2022-06-08 NOTE — Patient Instructions (Signed)
Continue topiramate 100mg  at bedtime Try rizatriptan for migraine attack.  Let me know if it helps better than sumatriptan Stop caffeine and soda.  Increase water intake Consider supplements:  2 Migrelief daily, coenzyme Q 10 300mg  daily Follow up 6 months.

## 2022-06-16 ENCOUNTER — Ambulatory Visit: Payer: Managed Care, Other (non HMO) | Admitting: Neurology

## 2022-07-30 ENCOUNTER — Other Ambulatory Visit: Payer: Self-pay | Admitting: Neurology

## 2022-10-19 ENCOUNTER — Ambulatory Visit: Payer: Medicaid Other | Admitting: Emergency Medicine

## 2022-10-19 ENCOUNTER — Encounter: Payer: Self-pay | Admitting: Emergency Medicine

## 2022-10-19 VITALS — BP 112/72 | HR 75 | Temp 98.7°F | Ht 65.0 in | Wt 143.0 lb

## 2022-10-19 DIAGNOSIS — Z8669 Personal history of other diseases of the nervous system and sense organs: Secondary | ICD-10-CM

## 2022-10-19 DIAGNOSIS — Z130 Encounter for screening for diseases of the blood and blood-forming organs and certain disorders involving the immune mechanism: Secondary | ICD-10-CM

## 2022-10-19 DIAGNOSIS — Z13228 Encounter for screening for other metabolic disorders: Secondary | ICD-10-CM | POA: Diagnosis not present

## 2022-10-19 DIAGNOSIS — Z13220 Encounter for screening for lipoid disorders: Secondary | ICD-10-CM | POA: Diagnosis not present

## 2022-10-19 DIAGNOSIS — Z0000 Encounter for general adult medical examination without abnormal findings: Secondary | ICD-10-CM | POA: Diagnosis not present

## 2022-10-19 DIAGNOSIS — Z1329 Encounter for screening for other suspected endocrine disorder: Secondary | ICD-10-CM

## 2022-10-19 LAB — CBC WITH DIFFERENTIAL/PLATELET
Basophils Absolute: 0.1 10*3/uL (ref 0.0–0.1)
Basophils Relative: 1 % (ref 0.0–3.0)
Eosinophils Absolute: 0.6 10*3/uL (ref 0.0–0.7)
Eosinophils Relative: 9.2 % — ABNORMAL HIGH (ref 0.0–5.0)
HCT: 39.3 % (ref 36.0–46.0)
Hemoglobin: 12.9 g/dL (ref 12.0–15.0)
Lymphocytes Relative: 19.8 % (ref 12.0–46.0)
Lymphs Abs: 1.4 10*3/uL (ref 0.7–4.0)
MCHC: 32.9 g/dL (ref 30.0–36.0)
MCV: 94.6 fl (ref 78.0–100.0)
Monocytes Absolute: 0.4 10*3/uL (ref 0.1–1.0)
Monocytes Relative: 5.8 % (ref 3.0–12.0)
Neutro Abs: 4.5 10*3/uL (ref 1.4–7.7)
Neutrophils Relative %: 64.2 % (ref 43.0–77.0)
Platelets: 291 10*3/uL (ref 150.0–400.0)
RBC: 4.15 Mil/uL (ref 3.87–5.11)
RDW: 13.1 % (ref 11.5–15.5)
WBC: 7 10*3/uL (ref 4.0–10.5)

## 2022-10-19 LAB — LIPID PANEL
Cholesterol: 167 mg/dL (ref 0–200)
HDL: 44 mg/dL (ref >39.00)
LDL Cholesterol: 107 mg/dL — ABNORMAL HIGH (ref 0–99)
NonHDL: 122.99
Total CHOL/HDL Ratio: 4
Triglycerides: 81 mg/dL (ref 0.0–149.0)
VLDL: 16.2 mg/dL (ref 0.0–40.0)

## 2022-10-19 LAB — COMPREHENSIVE METABOLIC PANEL
ALT: 20 U/L (ref 0–35)
AST: 22 U/L (ref 0–37)
Albumin: 4.3 g/dL (ref 3.5–5.2)
Alkaline Phosphatase: 82 U/L (ref 39–117)
BUN: 12 mg/dL (ref 6–23)
CO2: 22 mEq/L (ref 19–32)
Calcium: 9.3 mg/dL (ref 8.4–10.5)
Chloride: 111 mEq/L (ref 96–112)
Creatinine, Ser: 0.93 mg/dL (ref 0.40–1.20)
GFR: 80.95 mL/min (ref >60.00)
Glucose, Bld: 70 mg/dL (ref 70–99)
Potassium: 3.9 mEq/L (ref 3.5–5.1)
Sodium: 139 mEq/L (ref 135–145)
Total Bilirubin: 0.5 mg/dL (ref 0.2–1.2)
Total Protein: 7.4 g/dL (ref 6.0–8.3)

## 2022-10-19 LAB — HEMOGLOBIN A1C: Hgb A1c MFr Bld: 5.2 % (ref 4.6–6.5)

## 2022-10-19 NOTE — Patient Instructions (Signed)

## 2022-10-19 NOTE — Progress Notes (Signed)
Alicia [name] 32 y.o.   Chief Complaint  Patient presents with   Annual Exam    No concerns     HISTORY OF PRESENT ILLNESS: This is a 32 y.o. female here for annual exam. Overall doing well.  Saw gynecologist yesterday. Has no complaints or medical concerns today. History of migraines.  Well-controlled. History of prolactinoma.  No concerns.  HPI   Prior to Admission medications   Medication Sig Start Date End Date Taking? Authorizing Provider  fexofenadine (ALLEGRA) 60 MG tablet Take 60 mg by mouth daily.   Yes [provider]  rizatriptan (MAXALT) 10 MG tablet Take 1 tablet (10 mg total) by mouth as needed for migraine. May repeat in 2 hours if needed.  Maximum 2 tablets in 24 hours. 02/03/22  Yes Jaffe, Adam R, DO  topiramate (TOPAMAX) 100 MG tablet TAKE 1 TABLET BY MOUTH EVERYDAY AT BEDTIME 08/01/22  Yes Jaffe, Adam R, DO  sertraline (ZOLOFT) 50 MG tablet Take 1 tablet (50 mg total) by mouth daily. 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, hx anger issues did not have treatment    Past Surgical History:  Procedure Laterality Date   CESAREAN SECTION N/A 05/25/2016   Procedure: CESAREAN SECTION;  Surgeon: Mitchel Honour, DO;  Location: [facility];  Service: Obstetrics;  Laterality: N/A;  PRIMARY EDC 05/27/16 NKDA Heather K, RNFA   NO PAST SURGERIES      Social History   Socioeconomic History   Marital status: Single    Spouse  name: Not on file   Number of children: Not on file   Years of education: Not on file   Highest education level: Not on file  Occupational History   Not on file  Tobacco Use   Smoking status: Never   Smokeless tobacco: Never  Vaping Use   Vaping status: Never Used  Substance and Sexual Activity   Alcohol use: Yes    Comment: rare   Drug use: No   Sexual activity: Yes    Birth control/protection: Pill  Other Topics Concern   Not on file  Social History Narrative   Not on file   Social Determinants of Health   Financial Resource Strain: Not on file  Food Insecurity: Not on file  Transportation Needs: Not on file  Physical Activity: Not on file  Stress: Not on file  Social Connections: Not on file  Intimate Partner Violence: Not on file    Family History  Problem Relation Age of Onset   Hypertension Mother    Migraines Father    COPD Father    Migraines Sister    Migraines Brother    Stroke Maternal Grandmother    Diabetes Maternal Grandmother    Hyperlipidemia Maternal Grandmother    Asthma Maternal Grandfather    COPD Maternal Grandfather    Hyperlipidemia Maternal Grandfather    Migraines Paternal  Grandmother    Dementia Paternal Grandmother    Alzheimer's disease Paternal Grandfather    Other Cousin        duplicate 15 Q syndrome     Review of Systems  Constitutional: Negative.  Negative for chills and fever.  HENT: Negative.  Negative for congestion and sore throat.   Respiratory: Negative.  Negative for cough and shortness of breath.   Cardiovascular: Negative.  Negative for chest pain and palpitations.  Gastrointestinal:  Negative for abdominal pain, diarrhea, nausea and vomiting.  Genitourinary: Negative.  Negative for dysuria and hematuria.  Skin: Negative.  Negative for rash.  Neurological: Negative.  Negative for dizziness and headaches.  All other systems reviewed and are negative.   Vitals:   10/19/22 0913  BP: 112/72  Pulse: 75  Temp:  98.7 F (37.1 C)  SpO2: 98%    Physical Exam Vitals reviewed.  Constitutional:      Appearance: Normal appearance.  HENT:     Head: Normocephalic.     Right Ear: Tympanic membrane, ear canal and external ear normal.     Left Ear: Tympanic membrane, ear canal and external ear normal.     Mouth/Throat:     Mouth: Mucous membranes are moist.     Pharynx: Oropharynx is clear.  Eyes:     Extraocular Movements: Extraocular movements intact.     Conjunctiva/sclera: Conjunctivae normal.     Pupils: Pupils are equal, round, and reactive to light.  Cardiovascular:     Rate and Rhythm: Normal rate and regular rhythm.     Pulses: Normal pulses.     Heart sounds: Normal heart sounds.  Pulmonary:     Effort: Pulmonary effort is normal.     Breath sounds: Normal breath sounds.  Abdominal:     Palpations: Abdomen is soft.     Tenderness: There is no abdominal tenderness.  Musculoskeletal:     Cervical back: No tenderness.  Lymphadenopathy:     Cervical: No cervical adenopathy.  Skin:    General: Skin is warm and dry.     Capillary Refill: Capillary refill takes less than 2 seconds.  Neurological:     General: No focal deficit present.     Mental Status: She is alert and oriented to person, place, and time.  Psychiatric:        Mood and Affect: Mood normal.        Behavior: Behavior normal.      ASSESSMENT & PLAN: Problem List Items Addressed This Visit       Other   History of migraine    Well-controlled.  No concerns. Recently had dose of Topamax increased to 100 mg daily Using Maxalt 10 mg as needed      Other Visit Diagnoses     Routine general medical examination at a health care facility    -  Primary   Relevant Orders   CBC with Differential   Comprehensive metabolic panel   Hemoglobin A1c   Lipid panel   Screening for deficiency anemia       Relevant Orders   CBC with Differential   Screening for lipoid disorders       Relevant Orders   Lipid panel    Screening for endocrine, metabolic and immunity disorder       Relevant Orders   Comprehensive metabolic panel   Hemoglobin A1c        Modifiable risk factors discussed with patient. Anticipatory guidance according to age provided. The following  topics were also discussed: Social Determinants of Health Smoking.  Non-smoker Diet and nutrition Benefits of exercise Cancer screening and family history review Vaccinations review and recommendations Cardiovascular risk assessment and need for blood work Mental health including depression and anxiety Fall and accident prevention  Patient Instructions  Health Maintenance, Female Adopting a healthy lifestyle and getting preventive care are important in promoting health and wellness. Ask your health care provider about: The right schedule for you to have regular tests and exams. Things you can do on your own to prevent diseases and keep yourself healthy. What should I know about diet, weight, and exercise? Eat a healthy diet  Eat a diet that includes plenty of vegetables, fruits, low-fat dairy products, and lean protein. Do not eat a lot of foods that are high in solid fats, added sugars, or sodium. Maintain a healthy weight Body mass index (BMI) is used to identify weight problems. It estimates body fat based on height and weight. Your health care provider can help determine your BMI and help you achieve or maintain a healthy weight. Get regular exercise Get regular exercise. This is one of the most important things you can do for your health. Most adults should: Exercise for at least 150 minutes each week. The exercise should increase your heart rate and make you sweat (moderate-intensity exercise). Do strengthening exercises at least twice a week. This is in addition to the moderate-intensity exercise. Spend less time sitting. Even light physical activity can be beneficial. Watch cholesterol and blood lipids Have your blood tested  for lipids and cholesterol at 33 years of age, then have this test every 5 years. Have your cholesterol levels checked more often if: Your lipid or cholesterol levels are high. You are older than 33 years of age. You are at high risk for heart disease. What should I know about cancer screening? Depending on your health history and family history, you may need to have cancer screening at various ages. This may include screening for: Breast cancer. Cervical cancer. Colorectal cancer. Skin cancer. Lung cancer. What should I know about heart disease, diabetes, and high blood pressure? Blood pressure and heart disease High blood pressure causes heart disease and increases the risk of stroke. This is more likely to develop in people who have high blood pressure readings or are overweight. Have your blood pressure checked: Every 3-5 years if you are 28-72 years of age. Every year if you are 73 years old or older. Diabetes Have regular diabetes screenings. This checks your fasting blood sugar level. Have the screening done: Once every three years after age 80 if you are at a normal weight and have a low risk for diabetes. More often and at a younger age if you are overweight or have a high risk for diabetes. What should I know about preventing infection? Hepatitis B If you have a higher risk for hepatitis B, you should be screened for this virus. Talk with your health care provider to find out if you are at risk for hepatitis B infection. Hepatitis C Testing is recommended for: Everyone born from [birth year] through 1966. Anyone with known risk factors for hepatitis C. Sexually transmitted infections (STIs) Get screened for STIs, including gonorrhea and chlamydia, if: You are sexually active and are younger than 33 years of age. You are older than 33 years of age and your health care provider tells you that you are at risk for this type of infection. Your sexual activity has changed since you were  last screened, and you are at increased risk for chlamydia or gonorrhea. Ask your health care provider if you are at risk. Ask your health care provider about whether you are at high risk for HIV. Your health care provider may recommend a prescription medicine to help prevent HIV infection. If you choose to take medicine to prevent HIV, you should first get tested for HIV. You should then be tested every 3 months for as long as you are taking the medicine. Pregnancy If you are about to stop having your period (premenopausal) and you may become pregnant, seek counseling before you get pregnant. Take 400 to 800 micrograms (mcg) of folic acid every day if you become pregnant. Ask for birth control (contraception) if you want to prevent pregnancy. Osteoporosis and menopause Osteoporosis is a disease in which the bones lose minerals and strength with aging. This can result in bone fractures. If you are 29 years old or older, or if you are at risk for osteoporosis and fractures, ask your health care provider if you should: Be screened for bone loss. Take a calcium or vitamin D supplement to lower your risk of fractures. Be given hormone replacement therapy (HRT) to treat symptoms of menopause. Follow these instructions at home: Alcohol use Do not drink alcohol if: Your health care provider tells you not to drink. You are pregnant, may be pregnant, or are planning to become pregnant. If you drink alcohol: Limit how much you have to: 0-1 drink a day. Know how much alcohol is in your drink. In the [place] one drink equals one 12 oz bottle of beer (355 mL), one 5 oz glass of wine (148 mL), or one 1 oz glass of hard liquor (44 mL). Lifestyle Do not use any products that contain nicotine or tobacco. These products include cigarettes, chewing tobacco, and vaping devices, such as e-cigarettes. If you need help quitting, ask your health care provider. Do not use street drugs. Do not share needles. Ask your  health care provider for help if you need support or information about quitting drugs. General instructions Schedule regular health, dental, and eye exams. Stay current with your vaccines. Tell your health care provider if: You often feel depressed. You have ever been abused or do not feel safe at home. Summary Adopting a healthy lifestyle and getting preventive care are important in promoting health and wellness. Follow your health care provider's instructions about healthy diet, exercising, and getting tested or screened for diseases. Follow your health care provider's instructions on monitoring your cholesterol and blood pressure. This information is not intended to replace advice given to you by your health care provider. Make sure you discuss any questions you have with your health care provider. Document Revised: 06/01/2020 Document Reviewed: 06/01/2020 Elsevier Patient Education  2024 Elsevier Inc.     Edwina Barth, MD  Primary Care at [facility]

## 2022-10-19 NOTE — Assessment & Plan Note (Signed)
Well-controlled.  No concerns. Recently had dose of Topamax increased to 100 mg daily Using Maxalt 10 mg as needed

## 2022-12-08 NOTE — Progress Notes (Signed)
NEUROLOGY FOLLOW UP OFFICE NOTE  Alicia [name] [id number]  Assessment/Plan:   1  Migraine with aura, without status migrainosus, not intractable 2  Tension-type headache, not intractable 3.  Hypotension, symptomatic.    Migraine prevention:  Topiramate 100mg  at bedtime.  Migraine rescue:  Rizatriptan 10mg   Limit use of pain relievers to no more than 2 days out of week to prevent risk of rebound or medication-overuse headache. Keep headache diary She will let me know which neurologist's office in [place] to send her file. Follow up with PCP regarding dizziness/low blood pressure Follow up as needed       Subjective:  Alicia [name] is a 32 year old female with mood disorder who follows up for migraines.   UPDATE: Getting married this weekend.  She will be moving to [place].  Her fiance noticed some mild short-term memory issues on the topiramate but nothing significant.    For last 2 days, blood pressure has been low.  She does feel a little dizzy.  She is drinking about 80 oz water daily.  Has not been eating well as she has been busy and stressed about the wedding and moving to [place].  Intensity:  mostly mild Duration:  Hasn't tried the rizatriptan yet.  Lasts 1-2 hours with sumatriptan.   Frequency:  mild tension-type headaches 2 days a month;  3 moderate-severe migraines since May with the visual aura  Current NSAIDS/analgesics:  Tylenol (not used) Current triptans:  rizatriptan 10mg  Current ergotamine:  none Current anti-emetic:  none Current muscle relaxants:  none Current Antihypertensive medications:  none Current Antidepressant medications:  none Current Anticonvulsant medications:  topiramate 100mg  QHS Current anti-CGRP:  none Current Vitamins/Herbal/Supplements:  none Current Antihistamines/Decongestants:  none Other therapy:  heat mask, massage Hormone/birth control:  none  She had an MRI of brain and pituitary with and without contrast on 09/12/2021 for  evaluation of elevated prolactin levels, which was personally reviewed and revealed a single nonspecific punctate focus in the high right frontal white matter as well as 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    Caffeine:  1 cup black tea every morning.  However, increased soda Diet:  Less water.  Increased soda.  Eating healthier.   Exercise:  stopped exercise due to wrist injury. Depression/Anxiety:  yes.  History of panic attacks. Other pain:  none Sleep hygiene:  Poor.  Getting less sleep related to her daughter not sleeping well.    HISTORY:  In highschool, she started having headaches described severe right-sided pounding/throbbing pain with nausea, photophobia, phonophobia, preceded by tunnel vision/spots in vision, and when severe she sees change in colors. Prodrome with difficulty focusing.  They last 24 to 36 hours. Topiramate was helpful.  On topiramate, they occurred once a month.  Topiramate was discontinued after her pharmacist informed her that it may lower efficacy of her birth control medication.  They occur 3-4 times a week since taken off of topiramate.   Triggers:  emotional stress, menses.  Relieving factors:  heat to back of neck and cold compress on forehead.       Past NSAIDS/analgesics:  Excedrin Migraine, Tylenol Past abortive triptans:  sumatriptan tab Past abortive ergotamine:  none Past muscle relaxants:  none Past anti-emetic:  Zofran, promethazine Past antihypertensive medications:  none Past antidepressant medications:  sertraline, escitalopram Past anticonvulsant medications:  none Past anti-CGRP:  Ajovy (tongue swelling/blisters) Past vitamins/Herbal/Supplements:  none Past antihistamines/decongestants:  Flonase Other past  therapies:  none       Family history of headache:  paternal grandmother, father, sister, brother  PAST MEDICAL HISTORY: Past Medical History:  Diagnosis Date    Childhood asthma    Frequent headaches    Headache    Medical history non-contributory    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     rizatriptan (MAXALT) 10 MG tablet Take 1 tablet (10 mg total) by mouth as needed for migraine. May repeat in 2 hours if needed.  Maximum 2 tablets in 24 hours. 10 tablet 5   topiramate (TOPAMAX) 100 MG tablet TAKE 1 TABLET BY MOUTH EVERYDAY AT BEDTIME 90 tablet 1   [DISCONTINUED] sertraline (ZOLOFT) 50 MG tablet Take 1 tablet (50 mg total) by mouth daily. 90 tablet 1   No current facility-administered medications on file prior to visit.     ALLERGIES: Allergies  Allergen Reactions   Lexapro [Escitalopram Oxalate] Diarrhea and Nausea Only    Headache   Ajovy [Fremanezumab-Vfrm] Swelling and Other (See Comments)    Sores in mouth   Sertraline Other (See Comments)    FAMILY HISTORY: Family History  Problem Relation Age of Onset   Hypertension Mother    Migraines Father    COPD Father    Migraines Sister    Migraines Brother    Stroke Maternal Grandmother    Diabetes Maternal Grandmother    Hyperlipidemia Maternal Grandmother    Asthma Maternal Grandfather    COPD Maternal Grandfather    Hyperlipidemia Maternal Grandfather    Migraines Paternal Grandmother    Dementia Paternal Grandmother    Alzheimer's disease Paternal Grandfather    Other Cousin        duplicate 15 Q syndrome      Objective:  Blood pressure 99/69, pulse 67, height 5\' 5"  (1.651 m), weight 142 lb 6.4 oz (64.6 kg), SpO2 99%. General: No acute distress.  Patient appears well-groomed.   Head:  Normocephalic/atraumatic Neck:  Supple.  No paraspinal tenderness.  Full range of motion. Heart:  Regular rate and rhythm. Neuro:  Alert and oriented.  Speech fluent and  not dysarthric.  Language intact.  CN II-XII intact.  Bulk and tone normal.  Muscle strength 5/5 throughout.  Deep tendon reflexes 2+ throughout.  Gait normal.  Romberg negative.     Shon Millet, DO  CC: Georgina Quint, MD

## 2022-12-12 ENCOUNTER — Encounter: Payer: Self-pay | Admitting: Neurology

## 2022-12-12 ENCOUNTER — Ambulatory Visit: Payer: Managed Care, Other (non HMO) | Admitting: Neurology

## 2022-12-12 VITALS — BP 99/69 | HR 67 | Ht 65.0 in | Wt 142.4 lb

## 2022-12-12 DIAGNOSIS — I959 Hypotension, unspecified: Secondary | ICD-10-CM

## 2022-12-12 DIAGNOSIS — G43109 Migraine with aura, not intractable, without status migrainosus: Secondary | ICD-10-CM

## 2022-12-12 DIAGNOSIS — G44219 Episodic tension-type headache, not intractable: Secondary | ICD-10-CM

## 2022-12-12 NOTE — Patient Instructions (Signed)
Continue topiramate 100mg  at bedtime Rizatriptan as needed. Limit use of pain relievers to no more than 2 days out of week to prevent risk of rebound or medication-overuse headache. Keep headache diary Contact me to let me know which neurologist's office to send your notes. Contact your PCP regarding dizziness and low blood pressure

## 2022-12-26 NOTE — Telephone Encounter (Signed)
Telephone encounter created for results review.

## 2023-01-28 ENCOUNTER — Other Ambulatory Visit: Payer: Self-pay | Admitting: Neurology

## 2023-02-13 ENCOUNTER — Other Ambulatory Visit: Payer: Self-pay | Admitting: Neurology

## 2023-02-13 ENCOUNTER — Encounter: Payer: Self-pay | Admitting: Neurology

## 2023-02-13 MED ORDER — RIZATRIPTAN BENZOATE 10 MG PO TABS: 10.0000 mg | ORAL_TABLET | ORAL | 3 refills | Status: AC | PRN

## 2023-05-08 ENCOUNTER — Other Ambulatory Visit: Payer: Self-pay | Admitting: Neurology

## 2023-05-08 ENCOUNTER — Encounter: Payer: Self-pay | Admitting: Neurology

## 2023-08-06 ENCOUNTER — Other Ambulatory Visit: Payer: Self-pay | Admitting: Neurology
